# Patient Record
Sex: Female | Born: 1960 | Race: White | Hispanic: No | Marital: Single | Smoking: Never smoker
Health system: Southern US, Community
[De-identification: ages and names within clinical notes are randomized; demographics above are authoritative.]

## PROBLEM LIST (undated history)

## (undated) DIAGNOSIS — F5000 Anorexia nervosa, unspecified: Secondary | ICD-10-CM

## (undated) DIAGNOSIS — N644 Mastodynia: Secondary | ICD-10-CM

## (undated) DIAGNOSIS — M810 Age-related osteoporosis without current pathological fracture: Secondary | ICD-10-CM

## (undated) DIAGNOSIS — E039 Hypothyroidism, unspecified: Secondary | ICD-10-CM

## (undated) DIAGNOSIS — F411 Generalized anxiety disorder: Secondary | ICD-10-CM

## (undated) DIAGNOSIS — F603 Borderline personality disorder: Secondary | ICD-10-CM

## (undated) DIAGNOSIS — L02239 Carbuncle of trunk, unspecified: Secondary | ICD-10-CM

## (undated) DIAGNOSIS — E079 Disorder of thyroid, unspecified: Secondary | ICD-10-CM

## (undated) DIAGNOSIS — D3130 Benign neoplasm of unspecified choroid: Secondary | ICD-10-CM

## (undated) DIAGNOSIS — L02229 Furuncle of trunk, unspecified: Secondary | ICD-10-CM

## (undated) DIAGNOSIS — L259 Unspecified contact dermatitis, unspecified cause: Secondary | ICD-10-CM

## (undated) DIAGNOSIS — F5102 Adjustment insomnia: Secondary | ICD-10-CM

## (undated) DIAGNOSIS — R071 Chest pain on breathing: Secondary | ICD-10-CM

## (undated) DIAGNOSIS — R55 Syncope and collapse: Secondary | ICD-10-CM

## (undated) DIAGNOSIS — K219 Gastro-esophageal reflux disease without esophagitis: Secondary | ICD-10-CM

## (undated) DIAGNOSIS — F909 Attention-deficit hyperactivity disorder, unspecified type: Secondary | ICD-10-CM

## (undated) DIAGNOSIS — E0500 Thyrotoxicosis with diffuse goiter without thyrotoxic crisis or storm: Secondary | ICD-10-CM

## (undated) DIAGNOSIS — R222 Localized swelling, mass and lump, trunk: Secondary | ICD-10-CM

## (undated) DIAGNOSIS — S8000XA Contusion of unspecified knee, initial encounter: Secondary | ICD-10-CM

## (undated) HISTORY — DX: Borderline personality disorder: F60.3

## (undated) HISTORY — DX: Thyrotoxicosis with diffuse goiter without thyrotoxic crisis or storm: E05.00

## (undated) HISTORY — DX: Hypothyroidism, unspecified: E03.9

## (undated) HISTORY — DX: Unspecified contact dermatitis, unspecified cause: L25.9

## (undated) HISTORY — DX: Localized swelling, mass and lump, trunk: R22.2

## (undated) HISTORY — DX: Carbuncle of trunk, unspecified: L02.239

## (undated) HISTORY — DX: Attention-deficit hyperactivity disorder, unspecified type: F90.9

## (undated) HISTORY — DX: Contusion of unspecified knee, initial encounter: S80.00XA

## (undated) HISTORY — DX: Chest pain on breathing: R07.1

## (undated) HISTORY — DX: Age-related osteoporosis without current pathological fracture: M81.0

## (undated) HISTORY — DX: Mastodynia: N64.4

## (undated) HISTORY — DX: Gastro-esophageal reflux disease without esophagitis: K21.9

## (undated) HISTORY — DX: Anorexia nervosa, unspecified: F50.00

## (undated) HISTORY — DX: Furuncle of trunk, unspecified: L02.229

## (undated) HISTORY — PX: CHOLECYSTECTOMY: SHX55

## (undated) HISTORY — DX: Benign neoplasm of unspecified choroid: D31.30

## (undated) HISTORY — DX: Adjustment insomnia: F51.02

## (undated) HISTORY — PX: TONSILLECTOMY: SUR1361

## (undated) HISTORY — PX: THYROID SURGERY: SHX805

## (undated) HISTORY — DX: Generalized anxiety disorder: F41.1

---

## 1999-08-15 ENCOUNTER — Other Ambulatory Visit: Admission: RE | Admit: 1999-08-15 | Discharge: 1999-08-15 | Payer: Self-pay | Admitting: Gynecology

## 2000-01-14 ENCOUNTER — Encounter: Payer: Self-pay | Admitting: Emergency Medicine

## 2000-01-14 ENCOUNTER — Emergency Department (HOSPITAL_COMMUNITY): Admission: EM | Admit: 2000-01-14 | Discharge: 2000-01-14 | Payer: Self-pay

## 2000-05-27 ENCOUNTER — Encounter: Admission: RE | Admit: 2000-05-27 | Discharge: 2000-08-25 | Payer: Self-pay | Admitting: Psychology

## 2007-12-23 ENCOUNTER — Emergency Department (HOSPITAL_BASED_OUTPATIENT_CLINIC_OR_DEPARTMENT_OTHER): Admission: EM | Admit: 2007-12-23 | Discharge: 2007-12-23 | Payer: Self-pay | Admitting: Emergency Medicine

## 2008-08-07 ENCOUNTER — Emergency Department (HOSPITAL_BASED_OUTPATIENT_CLINIC_OR_DEPARTMENT_OTHER): Admission: EM | Admit: 2008-08-07 | Discharge: 2008-08-07 | Payer: Self-pay | Admitting: Emergency Medicine

## 2010-10-14 LAB — BASIC METABOLIC PANEL
BUN: 12 mg/dL (ref 6–23)
CO2: 30 mEq/L (ref 19–32)
Calcium: 8.5 mg/dL (ref 8.4–10.5)
Chloride: 103 mEq/L (ref 96–112)
Creatinine, Ser: 0.7 mg/dL (ref 0.4–1.2)
GFR calc Af Amer: >60 mL/min (ref >60)
GFR calc non Af Amer: >60 mL/min (ref >60)
Glucose, Bld: 75 mg/dL (ref 70–99)
Potassium: 4.3 mEq/L (ref 3.5–5.1)
Sodium: 138 mEq/L (ref 135–145)

## 2010-10-14 LAB — DIFFERENTIAL
Basophils Absolute: 0.1 10*3/uL (ref 0.0–0.1)
Basophils Relative: 1 % (ref 0–1)
Eosinophils Absolute: 0.1 10*3/uL (ref 0.0–0.7)
Monocytes Absolute: 0.3 10*3/uL (ref 0.1–1.0)
Monocytes Relative: 8 % (ref 3–12)

## 2010-10-14 LAB — URINALYSIS, ROUTINE W REFLEX MICROSCOPIC
Bilirubin Urine: NEGATIVE
Nitrite: NEGATIVE
Specific Gravity, Urine: 1.031 — ABNORMAL HIGH (ref 1.005–1.030)
Urobilinogen, UA: 0.2 mg/dL (ref 0.0–1.0)
pH: 5.5 (ref 5.0–8.0)

## 2010-10-14 LAB — CBC
Hemoglobin: 11.2 g/dL — ABNORMAL LOW (ref 12.0–15.0)
MCHC: 32.6 g/dL (ref 30.0–36.0)
MCV: 90.2 fL (ref 78.0–100.0)
RBC: 3.82 MIL/uL — ABNORMAL LOW (ref 3.87–5.11)
RDW: 11.8 % (ref 11.5–15.5)

## 2010-10-14 LAB — URINE MICROSCOPIC-ADD ON

## 2010-10-14 LAB — PREGNANCY, URINE: Preg Test, Ur: NEGATIVE

## 2010-11-06 ENCOUNTER — Emergency Department (INDEPENDENT_AMBULATORY_CARE_PROVIDER_SITE_OTHER): Payer: Self-pay

## 2010-11-06 ENCOUNTER — Emergency Department (HOSPITAL_BASED_OUTPATIENT_CLINIC_OR_DEPARTMENT_OTHER)
Admission: EM | Admit: 2010-11-06 | Discharge: 2010-11-06 | Disposition: A | Discharge status: Home / Self Care | Payer: Self-pay | Attending: Emergency Medicine | Admitting: Emergency Medicine

## 2010-11-06 DIAGNOSIS — E039 Hypothyroidism, unspecified: Secondary | ICD-10-CM | POA: Insufficient documentation

## 2010-11-06 DIAGNOSIS — H9209 Otalgia, unspecified ear: Secondary | ICD-10-CM

## 2010-11-06 DIAGNOSIS — G8929 Other chronic pain: Secondary | ICD-10-CM | POA: Insufficient documentation

## 2010-11-06 DIAGNOSIS — R42 Dizziness and giddiness: Secondary | ICD-10-CM | POA: Insufficient documentation

## 2010-11-06 LAB — DIFFERENTIAL
Basophils Absolute: 0 10*3/uL (ref 0.0–0.1)
Basophils Relative: 1 % (ref 0–1)
Eosinophils Absolute: 0.1 10*3/uL (ref 0.0–0.7)
Eosinophils Relative: 2 % (ref 0–5)
Monocytes Absolute: 0.4 10*3/uL (ref 0.1–1.0)

## 2010-11-06 LAB — COMPREHENSIVE METABOLIC PANEL
ALT: 13 U/L (ref 0–35)
AST: 20 U/L (ref 0–37)
Albumin: 3.9 g/dL (ref 3.5–5.2)
Calcium: 9.5 mg/dL (ref 8.4–10.5)
GFR calc Af Amer: >60 mL/min (ref >60)
Glucose, Bld: 88 mg/dL (ref 70–99)
Sodium: 143 mEq/L (ref 135–145)
Total Protein: 6.4 g/dL (ref 6.0–8.3)

## 2010-11-06 LAB — CBC
MCHC: 33.1 g/dL (ref 30.0–36.0)
Platelets: 162 10*3/uL (ref 150–400)
RDW: 12.2 % (ref 11.5–15.5)
WBC: 3.7 10*3/uL — ABNORMAL LOW (ref 4.0–10.5)

## 2010-11-06 LAB — URINALYSIS, ROUTINE W REFLEX MICROSCOPIC
Glucose, UA: NEGATIVE mg/dL
Hgb urine dipstick: NEGATIVE
Ketones, ur: NEGATIVE mg/dL
pH: 7.5 (ref 5.0–8.0)

## 2011-06-30 ENCOUNTER — Emergency Department (HOSPITAL_BASED_OUTPATIENT_CLINIC_OR_DEPARTMENT_OTHER)
Admission: EM | Admit: 2011-06-30 | Discharge: 2011-06-30 | Disposition: A | Discharge status: Home / Self Care | Payer: Medicare Other | Attending: Emergency Medicine | Admitting: Emergency Medicine

## 2011-06-30 ENCOUNTER — Emergency Department (INDEPENDENT_AMBULATORY_CARE_PROVIDER_SITE_OTHER): Payer: Medicare Other

## 2011-06-30 ENCOUNTER — Other Ambulatory Visit: Payer: Self-pay

## 2011-06-30 ENCOUNTER — Encounter: Payer: Self-pay | Admitting: *Deleted

## 2011-06-30 DIAGNOSIS — M549 Dorsalgia, unspecified: Secondary | ICD-10-CM

## 2011-06-30 DIAGNOSIS — E039 Hypothyroidism, unspecified: Secondary | ICD-10-CM | POA: Insufficient documentation

## 2011-06-30 DIAGNOSIS — R0602 Shortness of breath: Secondary | ICD-10-CM

## 2011-06-30 DIAGNOSIS — R079 Chest pain, unspecified: Secondary | ICD-10-CM

## 2011-06-30 DIAGNOSIS — R0789 Other chest pain: Secondary | ICD-10-CM

## 2011-06-30 DIAGNOSIS — R222 Localized swelling, mass and lump, trunk: Secondary | ICD-10-CM

## 2011-06-30 DIAGNOSIS — I499 Cardiac arrhythmia, unspecified: Secondary | ICD-10-CM

## 2011-06-30 HISTORY — DX: Disorder of thyroid, unspecified: E07.9

## 2011-06-30 LAB — CBC
Hemoglobin: 12.7 g/dL (ref 12.0–15.0)
MCHC: 33.4 g/dL (ref 30.0–36.0)
RDW: 12.5 % (ref 11.5–15.5)

## 2011-06-30 LAB — COMPREHENSIVE METABOLIC PANEL
ALT: 14 U/L (ref 0–35)
Albumin: 4.1 g/dL (ref 3.5–5.2)
Alkaline Phosphatase: 63 U/L (ref 39–117)
Glucose, Bld: 92 mg/dL (ref 70–99)
Potassium: 4 mEq/L (ref 3.5–5.1)
Sodium: 142 mEq/L (ref 135–145)
Total Protein: 6.5 g/dL (ref 6.0–8.3)

## 2011-06-30 LAB — CARDIAC PANEL(CRET KIN+CKTOT+MB+TROPI)
Relative Index: INVALID (ref 0.0–2.5)
Relative Index: INVALID (ref 0.0–2.5)
Troponin I: <0.3 ng/mL (ref <0.30)

## 2011-06-30 MED ORDER — DIPHENHYDRAMINE HCL 50 MG/ML IJ SOLN
50.0000 mg | Freq: Once | INTRAMUSCULAR | Status: AC
  Administered 2011-06-30: 50 mg via INTRAVENOUS
  Filled 2011-06-30: qty 1

## 2011-06-30 MED ORDER — OXYCODONE-ACETAMINOPHEN 5-325 MG PO TABS: 1.0000 | ORAL_TABLET | ORAL | Status: AC | PRN

## 2011-06-30 MED ORDER — MORPHINE SULFATE 4 MG/ML IJ SOLN
4.0000 mg | Freq: Once | INTRAMUSCULAR | Status: AC
  Administered 2011-06-30: 4 mg via INTRAVENOUS

## 2011-06-30 MED ORDER — IOHEXOL 350 MG/ML SOLN
80.0000 mL | Freq: Once | INTRAVENOUS | Status: AC | PRN
  Administered 2011-06-30: 80 mL via INTRAVENOUS

## 2011-06-30 MED ORDER — MORPHINE SULFATE 4 MG/ML IJ SOLN
INTRAMUSCULAR | Status: AC
  Filled 2011-06-30: qty 1

## 2011-06-30 MED ORDER — SODIUM CHLORIDE 0.9 % IV BOLUS (SEPSIS)
500.0000 mL | Freq: Once | INTRAVENOUS | Status: AC
  Administered 2011-06-30: 500 mL via INTRAVENOUS

## 2011-06-30 MED ORDER — SODIUM CHLORIDE 0.9 % IV BOLUS (SEPSIS): 500.0000 mL | Freq: Once | INTRAVENOUS | Status: DC

## 2011-06-30 MED ORDER — HYDROCORTISONE SOD SUCCINATE 100 MG IJ SOLR
200.0000 mg | Freq: Once | INTRAMUSCULAR | Status: AC
  Administered 2011-06-30: 100 mg via INTRAVENOUS
  Filled 2011-06-30: qty 2

## 2011-06-30 MED ORDER — MORPHINE SULFATE 4 MG/ML IJ SOLN: 4.0000 mg | Freq: Once | INTRAMUSCULAR | Status: DC

## 2011-06-30 NOTE — ED Notes (Signed)
Pt c/o generalized cp and SOB x 1 week

## 2011-06-30 NOTE — ED Notes (Signed)
I met patient at waiting as secretary told me the patient was having chest pain at check-in. Patient stated she feels her pain may be only chest-wall pain, but that she has had heart related problems in her previous history. I took patient to room and notified nurse. I took ECG.

## 2011-06-30 NOTE — ED Provider Notes (Signed)
History     CSN: [id number]  Arrival date & time 06/30/11  1339   First MD Initiated Contact with Patient 06/30/11 1353      Chief Complaint  Patient presents with  . Chest Pain  . Shortness of Breath    (Consider location/radiation/quality/duration/timing/severity/associated sxs/prior treatment) HPI  H/o anorexia presents with chest pain shortness of breath. Patient states that she has experienced sharp left axillary, chest pain which has been constant for one week but intermittently worsening. It is worse with movement and with taking a deep breath. She also complains of generalized chest pressure which has been present for several days as well. She complains of shortness of breath with talking with exertion. She denies known injury although she states that 2 days or if his symptom onset she did fall onto her left side only on her left elbow. sHe feels that this is unrelated. She does explain that she has a "cardiac history" and states that when she was previously being treated for anorexia she was told that she had a weak heart. She also states that she's had a history of 2 lung nodules and has been unable to follow with pulmonary secondary to lack of insurance. She denies fever, chills. Denies h/o VTE in self or family. No recent hosp/surg/immob. Denies exogenous hormone use, no leg pain or swelling. Occ cough.   ED Notes, ED Provider Notes from 06/30/11 0000 to 06/30/11 13:46:48       Hennie Duos, RN 06/30/2011 13:44      Pt c/o generalized cp and SOB x 1 week    Past Medical History  Diagnosis Date  . Thyroid disease     Past Surgical History  Procedure Date  . Cholecystectomy   . Tonsillectomy     History reviewed. No pertinent family history.  History  Substance Use Topics  . Smoking status: Never Smoker   . Smokeless tobacco: Not on file  . Alcohol Use: No    OB History    Grav Para Term Preterm Abortions TAB SAB Ect Mult Living                  Review  of Systems  All other systems reviewed and are negative.   except as noted HPI  Allergies  Biaxin; Codeine; Darvon; Penicillins; Sulfa antibiotics; and Ivp dye  Home Medications   Current Outpatient Rx  Name Route Sig Dispense Refill  . OXYCODONE-ACETAMINOPHEN 5-325 MG PO TABS Oral Take 1 tablet by mouth every 4 (four) hours as needed for pain. 20 tablet 0    BP 94/73  Pulse 64  Temp(Src) 98.9 F (37.2 C) (Oral)  Ht 5\' 1"  (1.549 m)  Wt 115 lb (52.164 kg)  BMI 21.73 kg/m2  SpO2 100%  Physical Exam  Nursing note and vitals reviewed. Constitutional: She is oriented to person, place, and time. She appears well-developed.  HENT:  Head: Atraumatic.  Mouth/Throat: Oropharynx is clear and moist.  Eyes: Conjunctivae and EOM are normal. Pupils are equal, round, and reactive to light.  Neck: Normal range of motion. Neck supple. No tracheal deviation present.  Cardiovascular: Normal rate, regular rhythm, normal heart sounds and intact distal pulses.   Pulmonary/Chest: Effort normal and breath sounds normal. No respiratory distress. She has no wheezes. She has no rales. She exhibits tenderness.       +Lt axillary ttp- states it reproduces the sharp cp only  Abdominal: Soft. She exhibits no distension. There is no tenderness. There is no  rebound and no guarding.  Musculoskeletal: Normal range of motion. She exhibits no edema and no tenderness.  Neurological: She is alert and oriented to person, place, and time.  Skin: Skin is warm and dry. No rash noted.  Psychiatric: She has a normal mood and affect.    Date: 06/30/2011  Rate: 67  Rhythm: normal sinus rhythm  QRS Axis: normal  Intervals: normal  ST/T Wave abnormalities: normal  Conduction Disutrbances:none  Narrative Interpretation:   Old EKG Reviewed: unchanged   ED Course  Procedures (including critical care time)  Labs Reviewed  CBC - Abnormal; Notable for the following:    WBC 3.3 (*)    All other components within  normal limits  CARDIAC PANEL(CRET KIN+CKTOT+MB+TROPI)  COMPREHENSIVE METABOLIC PANEL  CARDIAC PANEL(CRET KIN+CKTOT+MB+TROPI)   Dg Chest 2 View  06/30/2011  *RADIOLOGY REPORT*  Clinical Data: Left-sided chest pain radiating into the back over the past week.  Shortness of breath.  Arrhythmia.  CHEST - 2 VIEW 06/30/2011:  Comparison: None.  Findings: Cardiac silhouette normal in size.  Hilar and mediastinal contours unremarkable.  Lungs clear.  Bronchovascular markings normal.  No pleural effusions.  Retrosternal lobular "mass" on the lateral image.  Visualized bony thorax intact.  IMPRESSION: No acute cardiopulmonary disease.  Lobular retrosternal "mass" on the lateral image may be due to subpleural fat.  In the absence of prior studies for comparison, short-term interval follow-up chest x- ray in 2-3 months is suggested.  Original Report Authenticated By: Arnell Sieving, M.D.   Ct Angio Chest W/cm &/or Wo Cm  06/30/2011  *RADIOLOGY REPORT*  Clinical Data:  Evaluate mediastinal mass, pulmonary embolus.  Left lateral rib and chest pain radiating into the back.  Symptoms for 1 week.  Shortness of breath, arrhythmia.  Evaluate for pulmonary embolus.  CT ANGIOGRAPHY CHEST WITH CONTRAST  Technique:  Multidetector CT imaging of the chest was performed using the standard protocol during bolus administration of intravenous contrast.  Multiplanar CT image reconstructions including MIPs were obtained to evaluate the vascular anatomy.  Contrast: 80mL OMNIPAQUE IOHEXOL 350 MG/ML IV SOLN  Comparison:  Chest x-ray 06/30/2011  Findings:  The pulmonary arteries are well opacified.  There is no evidence for acute pulmonary embolus.  Heart size is normal.  No evidence for pulmonary edema.  No pulmonary nodules, pleural effusions, or infiltrates.  Within the anterior chest wall, there is a fluid density oval mass, accounting for the chest x-ray finding.  This measures 2.7 x 1.4 cm and 0 HU in density. Findings are consistent  with benign process.  Differential diagnosis includes a neurogenic tumor such as schwannoma or neurofibroma. Less likely, findings could be related to a a foregut duplication cyst. Images of the upper abdomen are unremarkable. Visualized osseous structures have a normal appearance.  Review of the MIP images confirms the above findings.  IMPRESSION:  1.  Technically adequate exam showing no evidence for acute pulmonary embolus. 2.  Probably benign low attenuation anterior chest wall lesion measuring 2.7 cm.  Further evaluation with chest MRI with contrast is suggested.  This can be performed in 6 months to assess stability and imaging features.  Original Report Authenticated By: Patterson Hammersmith, M.D.     1. Atypical chest pain   2. Chest wall mass       MDM  Atypical chest pain that has been present for approximately one week. The patient doesn't have any known risk factors for pulmonary embolism but she does have an abnormal chest  x-ray with possible mass on lateral image. She also has history of lung nodules and did not have primary care pulmonary followup. Her pain is 3/10 at this time she is declining pain medication. CT of her chest in order to further workup and evaluation.  EKG, labs reviewed and unremarkable. CTA pending. She was premedicated with hydrocortisone and Benadryl. She's not having an obvious acute reaction at this time. She complains of headache and appears anxious. Morphine ordered. Reassess.  Reviewed results with the patient. This includes her abnormal chest x-ray and CT scan. There is no pulmonary embolism. She can followup chest mass as an outpatient. She has been given the images of the disc as well as a printout of the results. The patient has had 2 sets of negative cardiac enzymes with ongoing chest pain for a week. Have very low suspicion that this is due to coronary syndrome. She does have some reproducible chest wall tenderness. Will send home with a prescription for  Percocet and give her referrals for primary care followup.       Forbes Cellar, MD 06/30/11 [phone]

## 2011-07-01 ENCOUNTER — Telehealth (HOSPITAL_BASED_OUTPATIENT_CLINIC_OR_DEPARTMENT_OTHER): Payer: Self-pay | Admitting: *Deleted

## 2011-07-01 NOTE — ED Notes (Signed)
Multiple phone calls x 6 today .  Patient called and spoke with Nursing Secretary and told them that she was seen yesterday, stated she left here and went to [facility] and saw her "mother's " pulmonology doctor and was told she needed to follow up asap.  Patient stated she needs a referral to [facility].  Patient's discharge instructions stated to follow up with her PCP in 2 days if she was still having problems, per Dr. Hyman Hopes.  Patient stated she does not have a PCP and that she comes to the emergency departments when she needs care.  Patient was told by this RN that I would call over and make the referral.  Call was placed to [facility] and attempted x 2 to make an appointment.  Was told by receptionist that I could not make a referral due to Dr. Hyman Hopes not being in their referral system.  Call was placed to patient and info was given.  Patient became hysterical and was yelling at this nurse. I was told to forget it and phone call was disconnected.  Patient called back to this nurse 3 more times within the next 2 hours.  Patient left a message on the 3rd call stating I needed to call Melissa at [facility] pul[facility]ology and make her an appointment, stated that I lied to her about being able to make a referral.  Spoke with a receptionist at [phone] about the above events.  Was able this time to get the patient an appointment on 07/07/10 @ 2pm with Dr. Chales Abrahams and that the patient needed to bring her medical records or her appointment would be cancelled.  Call placed to patient and info was given.  Verified that the patient received a copy of her CT and medical records to take with her.

## 2011-08-18 ENCOUNTER — Encounter: Payer: Self-pay | Admitting: Surgery

## 2011-08-19 ENCOUNTER — Ambulatory Visit: Payer: Medicare Other

## 2011-08-26 ENCOUNTER — Ambulatory Visit: Payer: Medicare Other | Admitting: Surgery

## 2011-11-04 ENCOUNTER — Ambulatory Visit: Payer: Medicare Other

## 2012-01-27 ENCOUNTER — Emergency Department (HOSPITAL_BASED_OUTPATIENT_CLINIC_OR_DEPARTMENT_OTHER)
Admission: EM | Admit: 2012-01-27 | Discharge: 2012-01-28 | Disposition: A | Discharge status: Home / Self Care | Payer: Medicare Other | Attending: Emergency Medicine | Admitting: Emergency Medicine

## 2012-01-27 ENCOUNTER — Encounter (HOSPITAL_BASED_OUTPATIENT_CLINIC_OR_DEPARTMENT_OTHER): Payer: Self-pay | Admitting: *Deleted

## 2012-01-27 ENCOUNTER — Emergency Department (HOSPITAL_BASED_OUTPATIENT_CLINIC_OR_DEPARTMENT_OTHER): Payer: Medicare Other

## 2012-01-27 DIAGNOSIS — S62309A Unspecified fracture of unspecified metacarpal bone, initial encounter for closed fracture: Secondary | ICD-10-CM | POA: Insufficient documentation

## 2012-01-27 DIAGNOSIS — Y998 Other external cause status: Secondary | ICD-10-CM | POA: Insufficient documentation

## 2012-01-27 DIAGNOSIS — M810 Age-related osteoporosis without current pathological fracture: Secondary | ICD-10-CM | POA: Insufficient documentation

## 2012-01-27 DIAGNOSIS — S62306A Unspecified fracture of fifth metacarpal bone, right hand, initial encounter for closed fracture: Secondary | ICD-10-CM

## 2012-01-27 DIAGNOSIS — Y9389 Activity, other specified: Secondary | ICD-10-CM | POA: Insufficient documentation

## 2012-01-27 DIAGNOSIS — W19XXXA Unspecified fall, initial encounter: Secondary | ICD-10-CM | POA: Insufficient documentation

## 2012-01-27 DIAGNOSIS — E039 Hypothyroidism, unspecified: Secondary | ICD-10-CM | POA: Insufficient documentation

## 2012-01-27 DIAGNOSIS — K219 Gastro-esophageal reflux disease without esophagitis: Secondary | ICD-10-CM | POA: Insufficient documentation

## 2012-01-27 MED ORDER — MORPHINE SULFATE 4 MG/ML IJ SOLN: 4.0000 mg | Freq: Once | INTRAMUSCULAR | Status: DC

## 2012-01-27 MED ORDER — KETOROLAC TROMETHAMINE 60 MG/2ML IM SOLN
60.0000 mg | Freq: Once | INTRAMUSCULAR | Status: AC
  Administered 2012-01-27: 60 mg via INTRAMUSCULAR
  Filled 2012-01-27: qty 2

## 2012-01-27 MED ORDER — ONDANSETRON 4 MG PO TBDP
4.0000 mg | ORAL_TABLET | Freq: Once | ORAL | Status: AC
  Administered 2012-01-27: 4 mg via ORAL
  Filled 2012-01-27: qty 1

## 2012-01-27 NOTE — ED Notes (Signed)
Pt slipped and fell injuring right hand and wrist, +PMS to extremity.

## 2012-01-27 NOTE — ED Notes (Signed)
Pt has bruising and swelling noted to outer aspect of right hand and palmar surface, +PMS.

## 2012-01-28 MED ORDER — ONDANSETRON 8 MG PO TBDP: 8.0000 mg | ORAL_TABLET | Freq: Three times a day (TID) | ORAL | Status: AC | PRN

## 2012-01-28 MED ORDER — OXYCODONE-ACETAMINOPHEN 5-325 MG PO TABS: 1.0000 | ORAL_TABLET | Freq: Four times a day (QID) | ORAL | Status: AC | PRN

## 2012-01-28 NOTE — ED Provider Notes (Signed)
History     CSN: [id number]  Arrival date & time 01/27/12  2156   First MD Initiated Contact with Patient 01/27/12 2226      Chief Complaint  Patient presents with  . Wrist Pain    (Consider location/radiation/quality/duration/timing/severity/associated sxs/prior treatment) HPI Patient is a 51 year old female who presents today complaining of acute onset of right wrist and right hand pain that she rates as a 10 out of 10. Patient was handling one of her dogs a when she fell on the affected hand. She is right-hand dominant. This occurred prior to arrival. Pain is worse with palpation movement. Patient has not taken any medications for this. She has history of prior right wrist fracture. Patient has obvious swelling. There are no other associated or modifying factors. Past Medical History  Diagnosis Date  . Thyroid disease   . Osteoporosis, unspecified   . Abdominal tenderness   . Transient disorder of initiating or maintaining sleep   . Anorexia nervosa   . Painful respiration   . Anxiety state, unspecified   . Attention deficit disorder with hyperactivity   . Benign neoplasm of choroid     Right  . Mastodynia   . Borderline personality disorder   . Carbuncle and furuncle of trunk   . Contact dermatitis and other eczema, due to unspecified cause   . Contusion of knee   . Esophageal reflux   . 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 Unspecified hypothyroidism   . Swelling, mass, or lump in chest     Past Surgical History  Procedure Date  . Cholecystectomy   . Tonsillectomy   . Tonsillectomy   . Thyroid surgery     History reviewed. No pertinent family history.  History  Substance Use Topics  . Smoking status: Never Smoker   . Smokeless tobacco: Not on file  . Alcohol Use: No    OB History    Grav Para Term Preterm Abortions TAB SAB Ect Mult Living                  Review of Systems  Constitutional: Negative.   HENT:  Negative.   Eyes: Negative.   Respiratory: Negative.   Cardiovascular: Negative.   Gastrointestinal: Negative.   Genitourinary: Negative.   Musculoskeletal:       Right wrist and hand pain  Skin: Negative.   Neurological: Negative.   Hematological: Negative.   Psychiatric/Behavioral: Negative.   All other systems reviewed and are negative.    Allergies  Cephalexin; Azithromycin; Clarithromycin; Codeine; Darvon; Penicillins; Sulfa antibiotics; and Ivp dye  Home Medications   Current Outpatient Rx  Name Route Sig Dispense Refill  . FAMCICLOVIR 500 MG PO TABS Oral Take 500 mg by mouth 2 (two) times daily.    Marland Kitchen LEVOTHYROXINE SODIUM 88 MCG PO TABS Oral Take 88 mcg by mouth daily.      BP 98/70  Pulse 57  Temp 98.7 F (37.1 C) (Oral)  Resp 18  Ht 5\' 1"  (1.549 m)  Wt 118 lb (53.524 kg)  BMI 22.30 kg/m2  SpO2 99%  Physical Exam  Nursing note and vitals reviewed. GEN: Well-developed, well-nourished female in no distress HEENT: Atraumatic, normocephalic.  EYES: PERRLA BL, no scleral icterus. NECK: Trachea midline, no meningismus CV: regular rate and rhythm. No murmurs, rubs, or gallops PULM: No respiratory distress.  No crackles, wheezes, or rales. GI: soft, non-tender. No guarding, rebound, or tenderness. + bowel sounds  GU: deferred  Neuro: cranial nerves grossly 2-12 intact, no abnormalities of strength or sensation, A and O x 3 MSK: Patient with right hand swelling along the right fifth metacarpal with ecchymosis appreciated both on the dorsum and palmar surface of the affected area. She is neurovascularly intact. Patient also complains of tenderness to palpation over the right wrist with some swelling noted but no obvious deformity. Otherwise within normal limits  Skin: No rashes petechiae, purpura, or jaundice Psych: no abnormality of mood   ED Course  Procedures (including critical care time)  Labs Reviewed - No data to display Dg Wrist Complete Right  01/27/2012   *RADIOLOGY REPORT*  Clinical Data: Fall, right wrist and hand pain.  RIGHT WRIST - COMPLETE 3+ VIEW  Comparison: None.  Findings: There is an oblique fracture through the distal fifth metacarpal with radial and volar angulation. Overlying soft tissue swelling.  No additional fracture identified. No dislocation.  No aggressive osseous lesion.  IMPRESSION: Fifth metacarpal bone fracture.  Original Report Authenticated By: Waneta Martins, M.D.   Dg Hand Complete Right  01/27/2012  *RADIOLOGY REPORT*  Clinical Data: Pain after injury with dog leash.  RIGHT HAND - COMPLETE 3+ VIEW  Comparison: Wrist 01/27/2012  Findings: There is an oblique fracture involving distal aspect of the fifth metacarpal.  There is associated soft tissue swelling. There is volar angulation at the fracture site.  No other fractures are identified.  IMPRESSION: Fifth metacarpal fracture.  Original Report Authenticated By: Patterson Hammersmith, M.D.     1. Fracture of fifth metacarpal bone of right hand       MDM  Patient was by myself. Based on evaluation patient had plain films of the right wrist and right hand. She did have a fractured right fifth metacarpal. Patient did not have significant displacement. She was given Toradol and Zofran for pain and nausea associated with this. She had no additional injuries. Patient preferred not to receive narcotic medication she would be driving home. She did have an ulnar gutter splint placed. She was reassessed following placement of this under my supervision. She was NVI.  Patient did have a sling placed as well. She was given referral information to Dr. Melvyn Novas from hand. She was also told she could followup with a hand surgeon or her choice. Patient was discharged in good condition.        Cyndra Numbers, MD 01/28/12 [phone]

## 2013-11-28 DIAGNOSIS — F1010 Alcohol abuse, uncomplicated: Secondary | ICD-10-CM | POA: Insufficient documentation

## 2013-11-28 DIAGNOSIS — F419 Anxiety disorder, unspecified: Secondary | ICD-10-CM | POA: Insufficient documentation

## 2013-11-28 DIAGNOSIS — F410 Panic disorder [episodic paroxysmal anxiety] without agoraphobia: Secondary | ICD-10-CM | POA: Insufficient documentation

## 2013-11-28 DIAGNOSIS — F603 Borderline personality disorder: Secondary | ICD-10-CM | POA: Insufficient documentation

## 2013-11-28 DIAGNOSIS — E0500 Thyrotoxicosis with diffuse goiter without thyrotoxic crisis or storm: Secondary | ICD-10-CM | POA: Insufficient documentation

## 2013-11-28 DIAGNOSIS — E039 Hypothyroidism, unspecified: Secondary | ICD-10-CM | POA: Insufficient documentation

## 2013-11-28 DIAGNOSIS — D3130 Benign neoplasm of unspecified choroid: Secondary | ICD-10-CM | POA: Insufficient documentation

## 2013-11-28 DIAGNOSIS — L259 Unspecified contact dermatitis, unspecified cause: Secondary | ICD-10-CM | POA: Insufficient documentation

## 2013-11-28 DIAGNOSIS — F5000 Anorexia nervosa, unspecified: Secondary | ICD-10-CM | POA: Insufficient documentation

## 2013-11-28 DIAGNOSIS — R911 Solitary pulmonary nodule: Secondary | ICD-10-CM | POA: Insufficient documentation

## 2013-11-28 DIAGNOSIS — F5102 Adjustment insomnia: Secondary | ICD-10-CM | POA: Insufficient documentation

## 2013-11-28 DIAGNOSIS — K219 Gastro-esophageal reflux disease without esophagitis: Secondary | ICD-10-CM | POA: Insufficient documentation

## 2013-11-28 DIAGNOSIS — G43909 Migraine, unspecified, not intractable, without status migrainosus: Secondary | ICD-10-CM | POA: Insufficient documentation

## 2013-11-28 DIAGNOSIS — F329 Major depressive disorder, single episode, unspecified: Secondary | ICD-10-CM | POA: Insufficient documentation

## 2013-11-28 DIAGNOSIS — F988 Other specified behavioral and emotional disorders with onset usually occurring in childhood and adolescence: Secondary | ICD-10-CM | POA: Insufficient documentation

## 2013-11-28 DIAGNOSIS — F32A Depression, unspecified: Secondary | ICD-10-CM | POA: Insufficient documentation

## 2014-02-11 DIAGNOSIS — R0602 Shortness of breath: Secondary | ICD-10-CM | POA: Insufficient documentation

## 2014-02-11 DIAGNOSIS — J309 Allergic rhinitis, unspecified: Secondary | ICD-10-CM | POA: Insufficient documentation

## 2014-02-11 DIAGNOSIS — M810 Age-related osteoporosis without current pathological fracture: Secondary | ICD-10-CM | POA: Insufficient documentation

## 2014-02-11 DIAGNOSIS — R55 Syncope and collapse: Secondary | ICD-10-CM | POA: Insufficient documentation

## 2014-02-11 DIAGNOSIS — G629 Polyneuropathy, unspecified: Secondary | ICD-10-CM | POA: Insufficient documentation

## 2014-02-11 DIAGNOSIS — I471 Supraventricular tachycardia: Secondary | ICD-10-CM | POA: Insufficient documentation

## 2014-02-11 DIAGNOSIS — H93299 Other abnormal auditory perceptions, unspecified ear: Secondary | ICD-10-CM | POA: Insufficient documentation

## 2014-02-11 DIAGNOSIS — E559 Vitamin D deficiency, unspecified: Secondary | ICD-10-CM | POA: Insufficient documentation

## 2014-03-21 DIAGNOSIS — R42 Dizziness and giddiness: Secondary | ICD-10-CM | POA: Insufficient documentation

## 2014-03-21 DIAGNOSIS — M1990 Unspecified osteoarthritis, unspecified site: Secondary | ICD-10-CM | POA: Insufficient documentation

## 2014-07-07 DIAGNOSIS — J029 Acute pharyngitis, unspecified: Secondary | ICD-10-CM | POA: Diagnosis not present

## 2014-07-07 DIAGNOSIS — R509 Fever, unspecified: Secondary | ICD-10-CM | POA: Diagnosis not present

## 2014-07-10 DIAGNOSIS — J209 Acute bronchitis, unspecified: Secondary | ICD-10-CM | POA: Diagnosis not present

## 2014-11-19 DIAGNOSIS — R110 Nausea: Secondary | ICD-10-CM | POA: Diagnosis not present

## 2014-11-19 DIAGNOSIS — R197 Diarrhea, unspecified: Secondary | ICD-10-CM | POA: Diagnosis not present

## 2014-11-19 DIAGNOSIS — E559 Vitamin D deficiency, unspecified: Secondary | ICD-10-CM | POA: Diagnosis not present

## 2014-11-19 DIAGNOSIS — E039 Hypothyroidism, unspecified: Secondary | ICD-10-CM | POA: Diagnosis not present

## 2014-11-19 DIAGNOSIS — M1612 Unilateral primary osteoarthritis, left hip: Secondary | ICD-10-CM | POA: Diagnosis not present

## 2014-11-19 DIAGNOSIS — G4700 Insomnia, unspecified: Secondary | ICD-10-CM | POA: Diagnosis not present

## 2014-12-06 DIAGNOSIS — M25559 Pain in unspecified hip: Secondary | ICD-10-CM | POA: Diagnosis not present

## 2014-12-06 DIAGNOSIS — M545 Low back pain: Secondary | ICD-10-CM | POA: Diagnosis not present

## 2014-12-06 DIAGNOSIS — M25552 Pain in left hip: Secondary | ICD-10-CM | POA: Diagnosis not present

## 2014-12-26 DIAGNOSIS — Z7189 Other specified counseling: Secondary | ICD-10-CM | POA: Diagnosis not present

## 2014-12-26 DIAGNOSIS — R300 Dysuria: Secondary | ICD-10-CM | POA: Diagnosis not present

## 2014-12-26 DIAGNOSIS — N390 Urinary tract infection, site not specified: Secondary | ICD-10-CM | POA: Diagnosis not present

## 2015-01-02 DIAGNOSIS — R109 Unspecified abdominal pain: Secondary | ICD-10-CM | POA: Diagnosis not present

## 2015-01-02 DIAGNOSIS — K7689 Other specified diseases of liver: Secondary | ICD-10-CM | POA: Diagnosis not present

## 2015-01-02 DIAGNOSIS — R3915 Urgency of urination: Secondary | ICD-10-CM | POA: Diagnosis not present

## 2015-01-10 DIAGNOSIS — R350 Frequency of micturition: Secondary | ICD-10-CM | POA: Diagnosis not present

## 2015-01-10 DIAGNOSIS — R3989 Other symptoms and signs involving the genitourinary system: Secondary | ICD-10-CM | POA: Diagnosis not present

## 2015-01-14 DIAGNOSIS — M1990 Unspecified osteoarthritis, unspecified site: Secondary | ICD-10-CM | POA: Diagnosis not present

## 2015-01-14 DIAGNOSIS — R5381 Other malaise: Secondary | ICD-10-CM | POA: Diagnosis not present

## 2015-01-14 DIAGNOSIS — I951 Orthostatic hypotension: Secondary | ICD-10-CM | POA: Diagnosis not present

## 2015-01-14 DIAGNOSIS — R5383 Other fatigue: Secondary | ICD-10-CM | POA: Diagnosis not present

## 2015-01-14 DIAGNOSIS — R12 Heartburn: Secondary | ICD-10-CM | POA: Diagnosis not present

## 2015-01-14 DIAGNOSIS — R110 Nausea: Secondary | ICD-10-CM | POA: Diagnosis not present

## 2015-01-14 DIAGNOSIS — F329 Major depressive disorder, single episode, unspecified: Secondary | ICD-10-CM | POA: Diagnosis not present

## 2015-01-14 DIAGNOSIS — E0590 Thyrotoxicosis, unspecified without thyrotoxic crisis or storm: Secondary | ICD-10-CM | POA: Diagnosis not present

## 2015-01-14 DIAGNOSIS — E559 Vitamin D deficiency, unspecified: Secondary | ICD-10-CM | POA: Diagnosis not present

## 2015-01-14 DIAGNOSIS — Z1211 Encounter for screening for malignant neoplasm of colon: Secondary | ICD-10-CM | POA: Diagnosis not present

## 2015-01-14 DIAGNOSIS — E876 Hypokalemia: Secondary | ICD-10-CM | POA: Diagnosis not present

## 2015-01-14 DIAGNOSIS — M810 Age-related osteoporosis without current pathological fracture: Secondary | ICD-10-CM | POA: Diagnosis not present

## 2015-01-14 DIAGNOSIS — G4700 Insomnia, unspecified: Secondary | ICD-10-CM | POA: Diagnosis not present

## 2015-01-14 DIAGNOSIS — R109 Unspecified abdominal pain: Secondary | ICD-10-CM | POA: Diagnosis not present

## 2015-01-16 DIAGNOSIS — G4700 Insomnia, unspecified: Secondary | ICD-10-CM | POA: Diagnosis not present

## 2015-01-22 DIAGNOSIS — R350 Frequency of micturition: Secondary | ICD-10-CM | POA: Diagnosis not present

## 2015-01-22 DIAGNOSIS — R109 Unspecified abdominal pain: Secondary | ICD-10-CM | POA: Diagnosis not present

## 2015-01-22 DIAGNOSIS — N390 Urinary tract infection, site not specified: Secondary | ICD-10-CM | POA: Diagnosis not present

## 2015-01-22 DIAGNOSIS — R3989 Other symptoms and signs involving the genitourinary system: Secondary | ICD-10-CM | POA: Diagnosis not present

## 2015-02-19 DIAGNOSIS — R3989 Other symptoms and signs involving the genitourinary system: Secondary | ICD-10-CM | POA: Diagnosis not present

## 2015-02-19 DIAGNOSIS — R350 Frequency of micturition: Secondary | ICD-10-CM | POA: Diagnosis not present

## 2015-04-18 DIAGNOSIS — R0789 Other chest pain: Secondary | ICD-10-CM | POA: Diagnosis not present

## 2015-04-18 DIAGNOSIS — R05 Cough: Secondary | ICD-10-CM | POA: Diagnosis not present

## 2015-04-18 DIAGNOSIS — J42 Unspecified chronic bronchitis: Secondary | ICD-10-CM | POA: Diagnosis not present

## 2015-04-29 DIAGNOSIS — I471 Supraventricular tachycardia: Secondary | ICD-10-CM | POA: Diagnosis not present

## 2015-04-29 DIAGNOSIS — R079 Chest pain, unspecified: Secondary | ICD-10-CM | POA: Diagnosis not present

## 2015-04-29 DIAGNOSIS — I951 Orthostatic hypotension: Secondary | ICD-10-CM | POA: Diagnosis not present

## 2015-04-29 DIAGNOSIS — F419 Anxiety disorder, unspecified: Secondary | ICD-10-CM | POA: Diagnosis not present

## 2015-04-29 DIAGNOSIS — R110 Nausea: Secondary | ICD-10-CM | POA: Diagnosis not present

## 2015-05-21 DIAGNOSIS — E559 Vitamin D deficiency, unspecified: Secondary | ICD-10-CM | POA: Diagnosis not present

## 2015-05-21 DIAGNOSIS — E039 Hypothyroidism, unspecified: Secondary | ICD-10-CM | POA: Diagnosis not present

## 2015-05-29 DIAGNOSIS — J42 Unspecified chronic bronchitis: Secondary | ICD-10-CM | POA: Diagnosis not present

## 2015-05-29 DIAGNOSIS — J309 Allergic rhinitis, unspecified: Secondary | ICD-10-CM | POA: Diagnosis not present

## 2015-05-29 DIAGNOSIS — R5383 Other fatigue: Secondary | ICD-10-CM | POA: Diagnosis not present

## 2015-08-05 DIAGNOSIS — B9789 Other viral agents as the cause of diseases classified elsewhere: Secondary | ICD-10-CM | POA: Diagnosis not present

## 2015-08-05 DIAGNOSIS — J309 Allergic rhinitis, unspecified: Secondary | ICD-10-CM | POA: Diagnosis not present

## 2015-08-05 DIAGNOSIS — J069 Acute upper respiratory infection, unspecified: Secondary | ICD-10-CM | POA: Diagnosis not present

## 2015-08-15 DIAGNOSIS — R05 Cough: Secondary | ICD-10-CM | POA: Diagnosis not present

## 2015-08-15 DIAGNOSIS — J449 Chronic obstructive pulmonary disease, unspecified: Secondary | ICD-10-CM | POA: Diagnosis not present

## 2015-08-15 DIAGNOSIS — J069 Acute upper respiratory infection, unspecified: Secondary | ICD-10-CM | POA: Diagnosis not present

## 2015-09-04 ENCOUNTER — Ambulatory Visit: Payer: Medicare Other | Admitting: Family Medicine

## 2015-09-05 ENCOUNTER — Ambulatory Visit: Payer: Medicare Other | Admitting: Family Medicine

## 2015-09-05 ENCOUNTER — Ambulatory Visit (HOSPITAL_BASED_OUTPATIENT_CLINIC_OR_DEPARTMENT_OTHER)
Admission: RE | Admit: 2015-09-05 | Discharge: 2015-09-05 | Disposition: A | Discharge status: Home / Self Care | Payer: Medicare Other | Source: Ambulatory Visit | Attending: Family Medicine | Admitting: Family Medicine

## 2015-09-05 ENCOUNTER — Ambulatory Visit (INDEPENDENT_AMBULATORY_CARE_PROVIDER_SITE_OTHER): Payer: Medicare Other | Admitting: Family Medicine

## 2015-09-05 ENCOUNTER — Encounter: Payer: Self-pay | Admitting: Family Medicine

## 2015-09-05 VITALS — BP 114/80 | HR 81 | Ht 61.0 in | Wt 128.0 lb

## 2015-09-05 DIAGNOSIS — M79672 Pain in left foot: Secondary | ICD-10-CM | POA: Diagnosis not present

## 2015-09-05 DIAGNOSIS — M2012 Hallux valgus (acquired), left foot: Secondary | ICD-10-CM | POA: Insufficient documentation

## 2015-09-05 DIAGNOSIS — S99922A Unspecified injury of left foot, initial encounter: Secondary | ICD-10-CM | POA: Diagnosis not present

## 2015-09-05 MED ORDER — HYDROCODONE-ACETAMINOPHEN 5-325 MG PO TABS: 1.0000 | ORAL_TABLET | Freq: Four times a day (QID) | ORAL | Status: DC | PRN

## 2015-09-05 NOTE — Patient Instructions (Addendum)
You have a fifth metatarsal contusion, peroneal tendon strain. Icing 15 minutes at a time 3-4 times a day. Norco as needed for severe pain (no driving on this medicine). Consider a topical medicine like capsaicin, aspercreme up to 4 times a day. Hard soled shoe or walking boot if needed to help with pain. Crutches are an option as well. If not improving over next 2 weeks follow up with me at that time.

## 2015-09-09 DIAGNOSIS — S99922A Unspecified injury of left foot, initial encounter: Secondary | ICD-10-CM | POA: Insufficient documentation

## 2015-09-09 NOTE — Progress Notes (Signed)
PCP: No primary care provider on file.  Subjective:   HPI: Patient is a 55 y.o. female here for left foot pain.  Patient reports 1 week ago she was walking in her backyard, stepped on a brick and inverted left ankle. Immediate pain lateral left foot and ankle. Couldn't bear weight initially. Pain level 2/10 at rest, up to 8/10 with ambulation, sharp. No swelling or bruising. No prior injuries here. Was wearing crocs when this happened. No skin changes, fever, other complaints.  Past Medical History  Diagnosis Date  . Thyroid disease   . Osteoporosis, unspecified   . Abdominal tenderness   . Transient disorder of initiating or maintaining sleep   . Anorexia nervosa   . Painful respiration   . Anxiety state, unspecified   . Attention deficit disorder with hyperactivity(314.01)   . Benign neoplasm of choroid     Right  . Mastodynia   . Borderline personality disorder   . Carbuncle and furuncle of trunk   . Contact dermatitis and other eczema, due to unspecified cause   . Contusion of knee   . Esophageal reflux   . 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 Unspecified hypothyroidism   . Swelling, mass, or lump in chest     Current Outpatient Prescriptions on File Prior to Visit  Medication Sig Dispense Refill  . famciclovir (FAMVIR) 500 MG tablet Take 500 mg by mouth 2 (two) times daily.    Marland Kitchen levothyroxine (SYNTHROID, LEVOTHROID) 88 MCG tablet Take 88 mcg by mouth daily.     No current facility-administered medications on file prior to visit.    Past Surgical History  Procedure Laterality Date  . Cholecystectomy    . Tonsillectomy    . Tonsillectomy    . Thyroid surgery      Allergies  Allergen Reactions  . Cephalexin Anaphylaxis  . Doxycycline Rash and Shortness Of Breath  . Erythromycin Base Other (See Comments) and Shortness Of Breath    GI Upset  . Azithromycin Other (See Comments)  . Bee Venom Other (See Comments)  .  Clarithromycin   . Codeine   . Darvon   . Extract Of [facility] Other (See Comments)  . Metronidazole Other (See Comments)    Mouth sores  . Nsaids Other (See Comments)    GI Upset  . Penicillins   . Sulfa Antibiotics   . Vitamin E Other (See Comments)  . Ivp Dye [Iodinated Diagnostic Agents] Nausea And Vomiting and Rash  . Prednisone Nausea And Vomiting and Rash    Social History   Social History  . Marital Status: Single    Spouse Name: N/A  . Number of Children: N/A  . Years of Education: N/A   Occupational History  . Not on file.   Social History Main Topics  . Smoking status: Never Smoker   . Smokeless tobacco: Not on file  . Alcohol Use: No  . Drug Use: No  . Sexual Activity: No   Other Topics Concern  . Not on file   Social History Narrative    No family history on file.  BP 114/80 mmHg  Pulse 81  Ht 5\' 1"  (1.549 m)  Wt 128 lb (58.06 kg)  BMI 24.20 kg/m2  Review of Systems: See HPI above.    Objective:  Physical Exam:  Gen: NAD, comfortable in exam room  Left foot/ankle: No gross deformity, swelling, ecchymoses FROM with pain on plantar flexion and external rotation  TTP 5th metatarsal and peroneal tendons. Negative ant drawer and talar tilt.   Negative syndesmotic compression. Thompsons test negative. NV intact distally.  Right foot/ankle: FROM without pain.    MSK u/s:  No cortical irregularity, edema overlying cortex, neovascularity overlying 5th metatarsal.  No visible tears of peroneal tendons.  Assessment & Plan:  1. Left foot injury - independently reviewed radiographs and MSK u/s - no evidence fracture or peroneal tendon tear.  Consistent with contusion, peroneal tendon strain.  Icing, hard soled shoe.  Norco as needed for severe pain.  Crutches if needed.  F/u in 2 weeks if not improving.

## 2015-09-09 NOTE — Assessment & Plan Note (Signed)
independently reviewed radiographs and MSK u/s - no evidence fracture or peroneal tendon tear.  Consistent with contusion, peroneal tendon strain.  Icing, hard soled shoe.  Norco as needed for severe pain.  Crutches if needed.  F/u in 2 weeks if not improving.

## 2015-09-25 ENCOUNTER — Ambulatory Visit (INDEPENDENT_AMBULATORY_CARE_PROVIDER_SITE_OTHER): Payer: Medicare Other | Admitting: Family Medicine

## 2015-09-25 ENCOUNTER — Encounter: Payer: Self-pay | Admitting: Family Medicine

## 2015-09-25 ENCOUNTER — Ambulatory Visit: Payer: Medicare Other | Admitting: Family Medicine

## 2015-09-25 VITALS — BP 92/64 | HR 71 | Ht 61.0 in | Wt 128.0 lb

## 2015-09-25 DIAGNOSIS — S99922D Unspecified injury of left foot, subsequent encounter: Secondary | ICD-10-CM

## 2015-09-25 DIAGNOSIS — S3992XA Unspecified injury of lower back, initial encounter: Secondary | ICD-10-CM | POA: Diagnosis present

## 2015-09-25 MED ORDER — DICLOFENAC SODIUM 75 MG PO TBEC: 75.0000 mg | DELAYED_RELEASE_TABLET | Freq: Two times a day (BID) | ORAL | Status: DC

## 2015-09-25 MED ORDER — CYCLOBENZAPRINE HCL 10 MG PO TABS: 10.0000 mg | ORAL_TABLET | Freq: Three times a day (TID) | ORAL | Status: DC | PRN

## 2015-09-25 NOTE — Patient Instructions (Signed)
Switch from the boot to a supportive shoe.  You're describing lumbar radiculopathy - disc herniation with a pinched nerve. Take tylenol for baseline pain relief (1-2 extra strength tabs 3x/day) Diclofenac twice a day with food for pain and inflammation. Flexeril as needed for muscle spasms (no driving on this medicine if it makes you sleepy). Stay as active as possible. Physical therapy has been shown to be helpful as well. Strengthening of low back muscles, abdominal musculature are key for long term pain relief. If not improving, will consider physical therapy, further imaging (MRI). Follow up with me in 1 month for reevaluation but call me sooner if you're having problems.

## 2015-09-26 DIAGNOSIS — S3992XA Unspecified injury of lower back, initial encounter: Secondary | ICD-10-CM | POA: Insufficient documentation

## 2015-09-26 NOTE — Assessment & Plan Note (Signed)
independently reviewed radiographs and MSK u/s last visit - no evidence fracture or peroneal tendon tear.  Clinically improved from contusion, peroneal tendon strain.  Can discontinue boot at this time to a supportive shoe.

## 2015-09-26 NOTE — Assessment & Plan Note (Signed)
describes lumbar radiculopathy but is improving since Thursday.  Diclofenac with flexeril as needed.  Home exercises.  Consider prednisone, physical therapy, imaging if not improving.  F/u in 1 month.

## 2015-09-26 NOTE — Progress Notes (Signed)
PCP: No primary care provider on file.  Subjective:   HPI: Patient is a 55 y.o. female here for left foot pain.  3/9: Patient reports 1 week ago she was walking in her backyard, stepped on a brick and inverted left ankle. Immediate pain lateral left foot and ankle. Couldn't bear weight initially. Pain level 2/10 at rest, up to 8/10 with ambulation, sharp. No swelling or bruising. No prior injuries here. Was wearing crocs when this happened. No skin changes, fever, other complaints.  3/29: Patient reports foot feels completely better. Still wearing boot. Pain level 0/10. Has been icing. Rarely needed norco. Reports having low back pain - worsened after picking up a thing of cat foot last Thursday. Pain was central low lumbar, sharp. Was bad for 2 days but has eased since then. Had radiation into left leg. No bowel/bladder dysfunction.  Past Medical History  Diagnosis Date  . Thyroid disease   . Osteoporosis, unspecified   . Abdominal tenderness   . Transient disorder of initiating or maintaining sleep   . Anorexia nervosa   . Painful respiration   . Anxiety state, unspecified   . Attention deficit disorder with hyperactivity(314.01)   . Benign neoplasm of choroid     Right  . Mastodynia   . Borderline personality disorder   . Carbuncle and furuncle of trunk   . Contact dermatitis and other eczema, due to unspecified cause   . Contusion of knee   . Esophageal reflux   . 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 Unspecified hypothyroidism   . Swelling, mass, or lump in chest     Current Outpatient Prescriptions on File Prior to Visit  Medication Sig Dispense Refill  . famciclovir (FAMVIR) 500 MG tablet Take 500 mg by mouth 2 (two) times daily.    Marland Kitchen levothyroxine (SYNTHROID, LEVOTHROID) 88 MCG tablet Take 88 mcg by mouth daily.    . midodrine (PROAMATINE) 5 MG tablet Take 5 mg by mouth.     No current facility-administered medications  on file prior to visit.    Past Surgical History  Procedure Laterality Date  . Cholecystectomy    . Tonsillectomy    . Tonsillectomy    . Thyroid surgery      Allergies  Allergen Reactions  . Cephalexin Anaphylaxis  . Doxycycline Rash and Shortness Of Breath  . Erythromycin Base Other (See Comments) and Shortness Of Breath    GI Upset  . Azithromycin Other (See Comments)  . Bee Venom Other (See Comments)  . Clarithromycin   . Codeine   . Darvon   . Extract Of [facility] Other (See Comments)  . Metronidazole Other (See Comments)    Mouth sores  . Nsaids Other (See Comments)    GI Upset  . Penicillins   . Sulfa Antibiotics   . Vitamin E Other (See Comments)  . Ivp Dye [Iodinated Diagnostic Agents] Nausea And Vomiting and Rash  . Prednisone Nausea And Vomiting and Rash    Social History   Social History  . Marital Status: Single    Spouse Name: N/A  . Number of Children: N/A  . Years of Education: N/A   Occupational History  . Not on file.   Social History Main Topics  . Smoking status: Never Smoker   . Smokeless tobacco: Not on file  . Alcohol Use: No  . Drug Use: No  . Sexual Activity: No   Other Topics Concern  . Not  on file   Social History Narrative    No family history on file.  BP 92/64 mmHg  Pulse 71  Ht 5\' 1"  (1.549 m)  Wt 128 lb (58.06 kg)  BMI 24.20 kg/m2  Review of Systems: See HPI above.    Objective:  Physical Exam:  Gen: NAD, comfortable in exam room  Left foot/ankle: No gross deformity, swelling, ecchymoses FROM without pain. No TTP 5th metatarsal and peroneal tendons now.  No other tenderness. Negative ant drawer and talar tilt.   Negative syndesmotic compression. Thompsons test negative. NV intact distally.  Right foot/ankle: FROM without pain.  Assessment & Plan:  1. Left foot injury - independently reviewed radiographs and [facility] u/s last visit - no evidence fracture or peroneal tendon tear.  Clinically improved from  contusion, peroneal tendon strain.  Can discontinue boot at this time to a supportive shoe.    2. Low back injury - describes lumbar radiculopathy but is improving since Thursday.  Diclofenac with flexeril as needed.  Home exercises.  Consider prednisone, physical therapy, imaging if not improving.  F/u in 1 month.

## 2015-10-03 DIAGNOSIS — J302 Other seasonal allergic rhinitis: Secondary | ICD-10-CM | POA: Diagnosis not present

## 2015-10-03 DIAGNOSIS — J42 Unspecified chronic bronchitis: Secondary | ICD-10-CM | POA: Diagnosis not present

## 2015-12-17 DIAGNOSIS — G629 Polyneuropathy, unspecified: Secondary | ICD-10-CM | POA: Diagnosis not present

## 2015-12-17 DIAGNOSIS — E034 Atrophy of thyroid (acquired): Secondary | ICD-10-CM | POA: Diagnosis not present

## 2015-12-17 DIAGNOSIS — F5102 Adjustment insomnia: Secondary | ICD-10-CM | POA: Diagnosis not present

## 2015-12-17 DIAGNOSIS — F5000 Anorexia nervosa, unspecified: Secondary | ICD-10-CM | POA: Diagnosis not present

## 2015-12-17 DIAGNOSIS — I471 Supraventricular tachycardia: Secondary | ICD-10-CM | POA: Diagnosis not present

## 2015-12-17 DIAGNOSIS — J410 Simple chronic bronchitis: Secondary | ICD-10-CM | POA: Diagnosis not present

## 2015-12-17 DIAGNOSIS — K219 Gastro-esophageal reflux disease without esophagitis: Secondary | ICD-10-CM | POA: Diagnosis not present

## 2015-12-17 DIAGNOSIS — M25552 Pain in left hip: Secondary | ICD-10-CM | POA: Diagnosis not present

## 2015-12-17 DIAGNOSIS — M818 Other osteoporosis without current pathological fracture: Secondary | ICD-10-CM | POA: Diagnosis not present

## 2015-12-17 DIAGNOSIS — Z1239 Encounter for other screening for malignant neoplasm of breast: Secondary | ICD-10-CM | POA: Diagnosis not present

## 2015-12-17 DIAGNOSIS — M222X2 Patellofemoral disorders, left knee: Secondary | ICD-10-CM | POA: Diagnosis not present

## 2015-12-17 DIAGNOSIS — E559 Vitamin D deficiency, unspecified: Secondary | ICD-10-CM | POA: Diagnosis not present

## 2015-12-18 DIAGNOSIS — Z0000 Encounter for general adult medical examination without abnormal findings: Secondary | ICD-10-CM | POA: Diagnosis not present

## 2015-12-18 DIAGNOSIS — I471 Supraventricular tachycardia: Secondary | ICD-10-CM | POA: Diagnosis not present

## 2015-12-18 DIAGNOSIS — M25552 Pain in left hip: Secondary | ICD-10-CM | POA: Diagnosis not present

## 2015-12-18 DIAGNOSIS — K219 Gastro-esophageal reflux disease without esophagitis: Secondary | ICD-10-CM | POA: Diagnosis not present

## 2015-12-18 DIAGNOSIS — E559 Vitamin D deficiency, unspecified: Secondary | ICD-10-CM | POA: Diagnosis not present

## 2015-12-18 DIAGNOSIS — G629 Polyneuropathy, unspecified: Secondary | ICD-10-CM | POA: Diagnosis not present

## 2015-12-18 DIAGNOSIS — J410 Simple chronic bronchitis: Secondary | ICD-10-CM | POA: Diagnosis not present

## 2015-12-18 DIAGNOSIS — M818 Other osteoporosis without current pathological fracture: Secondary | ICD-10-CM | POA: Diagnosis not present

## 2015-12-18 DIAGNOSIS — F5000 Anorexia nervosa, unspecified: Secondary | ICD-10-CM | POA: Diagnosis not present

## 2015-12-18 DIAGNOSIS — Z13220 Encounter for screening for lipoid disorders: Secondary | ICD-10-CM | POA: Diagnosis not present

## 2015-12-18 DIAGNOSIS — Z136 Encounter for screening for cardiovascular disorders: Secondary | ICD-10-CM | POA: Diagnosis not present

## 2015-12-18 DIAGNOSIS — E039 Hypothyroidism, unspecified: Secondary | ICD-10-CM | POA: Diagnosis not present

## 2016-02-04 DIAGNOSIS — Z1231 Encounter for screening mammogram for malignant neoplasm of breast: Secondary | ICD-10-CM | POA: Diagnosis not present

## 2016-02-04 DIAGNOSIS — Z780 Asymptomatic menopausal state: Secondary | ICD-10-CM | POA: Diagnosis not present

## 2016-02-04 DIAGNOSIS — M810 Age-related osteoporosis without current pathological fracture: Secondary | ICD-10-CM | POA: Diagnosis not present

## 2016-02-25 DIAGNOSIS — N951 Menopausal and female climacteric states: Secondary | ICD-10-CM | POA: Insufficient documentation

## 2016-02-25 DIAGNOSIS — M818 Other osteoporosis without current pathological fracture: Secondary | ICD-10-CM | POA: Diagnosis not present

## 2016-02-25 DIAGNOSIS — F331 Major depressive disorder, recurrent, moderate: Secondary | ICD-10-CM | POA: Diagnosis not present

## 2016-02-25 DIAGNOSIS — J410 Simple chronic bronchitis: Secondary | ICD-10-CM | POA: Diagnosis not present

## 2016-02-25 DIAGNOSIS — B009 Herpesviral infection, unspecified: Secondary | ICD-10-CM | POA: Diagnosis not present

## 2016-02-25 DIAGNOSIS — H8110 Benign paroxysmal vertigo, unspecified ear: Secondary | ICD-10-CM | POA: Diagnosis not present

## 2016-02-25 DIAGNOSIS — B9789 Other viral agents as the cause of diseases classified elsewhere: Secondary | ICD-10-CM | POA: Diagnosis not present

## 2016-02-25 DIAGNOSIS — J069 Acute upper respiratory infection, unspecified: Secondary | ICD-10-CM | POA: Diagnosis not present

## 2016-03-04 ENCOUNTER — Encounter: Payer: Self-pay | Admitting: Family Medicine

## 2016-03-04 ENCOUNTER — Ambulatory Visit (INDEPENDENT_AMBULATORY_CARE_PROVIDER_SITE_OTHER): Payer: Medicare Other | Admitting: Family Medicine

## 2016-03-04 ENCOUNTER — Encounter (INDEPENDENT_AMBULATORY_CARE_PROVIDER_SITE_OTHER): Payer: Self-pay

## 2016-03-04 DIAGNOSIS — M722 Plantar fascial fibromatosis: Secondary | ICD-10-CM | POA: Diagnosis not present

## 2016-03-04 NOTE — Patient Instructions (Signed)
You have plantar fasciitis Take tylenol or aleve as needed for pain  Plantar fascia stretch for 20-30 seconds (do 3 of these) in morning Lowering/raise on a step exercises 3 x 10 once or twice a day - this is very important for long term recovery. Can add heel walks, toe walks forward and backward as well Ice heel for 15 minutes as needed. Avoid flat shoes/barefoot walking as much as possible. Arch straps have been shown to help with pain. Try sports insoles or dr scholls active series insoles. Steroid injection is a consideration for short term pain relief if you are struggling. Physical therapy is also an option. Follow up with me in 1 month.

## 2016-03-06 DIAGNOSIS — M722 Plantar fascial fibromatosis: Secondary | ICD-10-CM | POA: Insufficient documentation

## 2016-03-06 DIAGNOSIS — R0789 Other chest pain: Secondary | ICD-10-CM | POA: Diagnosis not present

## 2016-03-06 NOTE — Progress Notes (Signed)
PCP: No primary care provider on file.  Subjective:   HPI: Patient is a 55 y.o. female here for right heel pain.  Patient reports she's had pain on plantar, lateral right heel for about 6 weeks now. No acute injury or trauma. Questionable swelling in this area. Pain is 6/10, worse with walking and other activities, sharp. No skin changes, numbness.  Past Medical History:  Diagnosis Date  . Abdominal tenderness   . Anorexia nervosa   . Anxiety state, unspecified   . Attention deficit disorder with hyperactivity(314.01)   . Benign neoplasm of choroid    Right  . Borderline personality disorder   . Carbuncle and furuncle of trunk   . Contact dermatitis and other eczema, due to unspecified cause   . Contusion of knee   . Esophageal reflux   . Mastodynia   . Osteoporosis, unspecified   . Painful respiration   . Swelling, mass, or lump in chest   . Thyroid disease   . 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 Transient disorder of initiating or maintaining sleep   . Unspecified hypothyroidism     Current Outpatient Prescriptions on File Prior to Visit  Medication Sig Dispense Refill  . famciclovir (FAMVIR) 500 MG tablet Take 500 mg by mouth 2 (two) times daily.    Marland Kitchen levothyroxine (SYNTHROID, LEVOTHROID) 88 MCG tablet Take 88 mcg by mouth daily.     No current facility-administered medications on file prior to visit.     Past Surgical History:  Procedure Laterality Date  . CHOLECYSTECTOMY    . THYROID SURGERY    . TONSILLECTOMY    . TONSILLECTOMY      Allergies  Allergen Reactions  . Cephalexin Anaphylaxis  . Doxycycline Rash and Shortness Of Breath  . Erythromycin Base Other (See Comments) and Shortness Of Breath    GI Upset  . Azithromycin Other (See Comments)  . Bee Venom Other (See Comments)  . Clarithromycin   . Codeine   . Darvon   . Extract Of [facility] Other (See Comments)  . Metronidazole Other (See Comments)    Mouth  sores  . Nsaids Other (See Comments)    GI Upset  . Penicillins   . Sulfa Antibiotics   . Vitamin E Other (See Comments)  . Ivp Dye [Iodinated Diagnostic Agents] Nausea And Vomiting, Rash and Other (See Comments)    Dyspnea  . Prednisone Nausea And Vomiting and Rash    Social History   Social History  . Marital status: Single    Spouse name: N/A  . Number of children: N/A  . Years of education: N/A   Occupational History  . Not on file.   Social History Main Topics  . Smoking status: Never Smoker  . Smokeless tobacco: Never Used  . Alcohol use No  . Drug use: No  . Sexual activity: No   Other Topics Concern  . Not on file   Social History Narrative  . No narrative on file    No family history on file.  BP 90/63   Pulse 78   Ht 5\' 1"  (1.549 m)   Wt 133 lb (60.3 kg)   BMI 25.13 kg/m   Review of Systems: See HPI above.    Objective:  Physical Exam:  Gen: NAD, comfortable in exam room  Right foot/ankle: No gross deformity, swelling, ecchymoses FROM ankle with 5/5 strength all directions. TTP plantar calcaneus laterally at PF insertion.  No  other tenderness of foot or ankle. Negative ant drawer and talar tilt.   Negative syndesmotic compression. Negative calcaneal squeeze. Thompsons test negative. NV intact distally.  Left foot/ankle: FROM without pain.    Assessment & Plan:  1. Right plantar fasciitis - shown home exercises and stretches to do daily.  Arch binders, otc orthotics.  Icing, tylenol or aleve if needed.  Consider PT, injection if not improving.  F/u in 1 month.

## 2016-03-06 NOTE — Assessment & Plan Note (Signed)
shown home exercises and stretches to do daily.  Arch binders, otc orthotics.  Icing, tylenol or aleve if needed.  Consider PT, injection if not improving.  F/u in 1 month.

## 2016-03-17 ENCOUNTER — Emergency Department (HOSPITAL_BASED_OUTPATIENT_CLINIC_OR_DEPARTMENT_OTHER): Payer: Medicare Other

## 2016-03-17 ENCOUNTER — Emergency Department (HOSPITAL_BASED_OUTPATIENT_CLINIC_OR_DEPARTMENT_OTHER)
Admission: EM | Admit: 2016-03-17 | Discharge: 2016-03-17 | Disposition: A | Discharge status: Home / Self Care | Payer: Medicare Other | Attending: Emergency Medicine | Admitting: Emergency Medicine

## 2016-03-17 ENCOUNTER — Encounter (HOSPITAL_BASED_OUTPATIENT_CLINIC_OR_DEPARTMENT_OTHER): Payer: Self-pay

## 2016-03-17 DIAGNOSIS — S8992XA Unspecified injury of left lower leg, initial encounter: Secondary | ICD-10-CM | POA: Diagnosis present

## 2016-03-17 DIAGNOSIS — Y999 Unspecified external cause status: Secondary | ICD-10-CM | POA: Insufficient documentation

## 2016-03-17 DIAGNOSIS — S82032A Displaced transverse fracture of left patella, initial encounter for closed fracture: Secondary | ICD-10-CM | POA: Insufficient documentation

## 2016-03-17 DIAGNOSIS — F909 Attention-deficit hyperactivity disorder, unspecified type: Secondary | ICD-10-CM | POA: Diagnosis not present

## 2016-03-17 DIAGNOSIS — R42 Dizziness and giddiness: Secondary | ICD-10-CM | POA: Diagnosis not present

## 2016-03-17 DIAGNOSIS — S4992XA Unspecified injury of left shoulder and upper arm, initial encounter: Secondary | ICD-10-CM | POA: Diagnosis not present

## 2016-03-17 DIAGNOSIS — S43402A Unspecified sprain of left shoulder joint, initial encounter: Secondary | ICD-10-CM | POA: Diagnosis not present

## 2016-03-17 DIAGNOSIS — Y939 Activity, unspecified: Secondary | ICD-10-CM | POA: Insufficient documentation

## 2016-03-17 DIAGNOSIS — W1839XA Other fall on same level, initial encounter: Secondary | ICD-10-CM | POA: Insufficient documentation

## 2016-03-17 DIAGNOSIS — M25512 Pain in left shoulder: Secondary | ICD-10-CM | POA: Diagnosis not present

## 2016-03-17 DIAGNOSIS — S82002A Unspecified fracture of left patella, initial encounter for closed fracture: Secondary | ICD-10-CM

## 2016-03-17 DIAGNOSIS — Y929 Unspecified place or not applicable: Secondary | ICD-10-CM | POA: Diagnosis not present

## 2016-03-17 DIAGNOSIS — S60221A Contusion of right hand, initial encounter: Secondary | ICD-10-CM | POA: Insufficient documentation

## 2016-03-17 DIAGNOSIS — M25531 Pain in right wrist: Secondary | ICD-10-CM | POA: Diagnosis not present

## 2016-03-17 DIAGNOSIS — S6991XA Unspecified injury of right wrist, hand and finger(s), initial encounter: Secondary | ICD-10-CM | POA: Diagnosis not present

## 2016-03-17 HISTORY — DX: Syncope and collapse: R55

## 2016-03-17 MED ORDER — ONDANSETRON HCL 4 MG PO TABS: 4.0000 mg | ORAL_TABLET | Freq: Four times a day (QID) | ORAL | 0 refills | Status: DC | PRN

## 2016-03-17 MED ORDER — HYDROCODONE-ACETAMINOPHEN 5-325 MG PO TABS
1.0000 | ORAL_TABLET | Freq: Once | ORAL | Status: AC
Start: 2016-03-17 — End: 2016-03-17
  Administered 2016-03-17: 1 via ORAL
  Filled 2016-03-17: qty 1

## 2016-03-17 MED ORDER — ONDANSETRON 4 MG PO TBDP
4.0000 mg | ORAL_TABLET | Freq: Once | ORAL | Status: AC
  Administered 2016-03-17: 4 mg via ORAL
  Filled 2016-03-17: qty 1

## 2016-03-17 MED ORDER — HYDROCODONE-ACETAMINOPHEN 5-325 MG PO TABS: 1.0000 | ORAL_TABLET | Freq: Four times a day (QID) | ORAL | 0 refills | Status: DC | PRN

## 2016-03-17 NOTE — ED Provider Notes (Signed)
[facility] Provider Note   CSN: [id number] Arrival date & time: 03/17/16  1232     History   Chief Complaint Chief Complaint  Patient presents with  . Fall    HPI Melanie Hartman is a 55 y.o. female.  HPI Patient presents after a trip and fall from standing landing on her left knee and right outstretched wrist. Denied any head or neck injury. She also is complaining of pain to the left shoulder at this time. No numbness or weakness. Patient states that she had a brief near syncopal episode after the fall. Has a history of vasovagal syndrome and states that the pain from her knee caused her near-syncope. She denies any lightheadedness or dizziness at this time. No palpitations or chest pain. Past Medical History:  Diagnosis Date  . Abdominal tenderness   . Anorexia nervosa   . Anxiety state, unspecified   . Attention deficit disorder with hyperactivity(314.01)   . Benign neoplasm of choroid    Right  . Borderline personality disorder   . Carbuncle and furuncle of trunk   . Contact dermatitis and other eczema, due to unspecified cause   . Contusion of knee   . Esophageal reflux   . Mastodynia   . Osteoporosis, unspecified   . Painful respiration   . Swelling, mass, or lump in chest   . Thyroid disease   . 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 Transient disorder of initiating or maintaining sleep   . Unspecified hypothyroidism   . Vasovagal syndrome     Patient Active Problem List   Diagnosis Date Noted  . Plantar fasciitis of right foot 03/06/2016  . Female climacteric state 02/25/2016  . Lower back injury 09/26/2015  . Injury of left foot 09/09/2015  . Head revolving around 03/21/2014  . Arthritis, degenerative 03/21/2014  . Avitaminosis D 02/11/2014  . Syncope and collapse 02/11/2014  . Breath shortness 02/11/2014  . Abnormal auditory perception 02/11/2014  . Allergic rhinitis 02/11/2014  . Ectopic atrial  tachycardia ([place]) 02/11/2014  . Neuropathy ([place]) 02/11/2014  . OP (osteoporosis) 02/11/2014  . Adjustment insomnia 11/28/2013  . AA (alcohol abuse) 11/28/2013  . AN (anorexia nervosa) 11/28/2013  . Anxiety 11/28/2013  . ADD (attention deficit disorder) 11/28/2013  . [place]ow disease 11/28/2013  . Benign neoplasm of choroid 11/28/2013  . Borderline personality disorder 11/28/2013  . Lesion of lung 11/28/2013  . CD (contact dermatitis) 11/28/2013  . Clinical depression 11/28/2013  . Acid reflux 11/28/2013  . Adult hypothyroidism 11/28/2013  . Headache, migraine 11/28/2013  . Panic disorder without agoraphobia 11/28/2013    Past Surgical History:  Procedure Laterality Date  . CHOLECYSTECTOMY    . THYROID SURGERY    . TONSILLECTOMY    . TONSILLECTOMY      OB History    No data available       Home Medications    Prior to Admission medications   Medication Sig Start Date End Date Taking? Authorizing Provider  famciclovir (FAMVIR) 500 MG tablet Take 500 mg by mouth 2 (two) times daily.    Historical Provider, MD  famotidine (PEPCID) 20 MG tablet Take 20 mg by mouth.    Historical Provider, MD  fluticasone (FLONASE) 50 MCG/ACT nasal spray 2 sprays by Each Nare route daily as needed.     Historical Provider, MD  HYDROcodone-acetaminophen (NORCO) 5-325 MG tablet Take 1-2 tablets by mouth every 6 (six) hours as needed for severe pain.  03/17/16   Julianne Rice, MD  ibandronate (BONIVA) 150 MG tablet Take 150 mg by mouth. 02/25/16   Historical Provider, MD  levothyroxine (SYNTHROID, LEVOTHROID) 88 MCG tablet Take 88 mcg by mouth daily.    Historical Provider, MD  midodrine (PROAMATINE) 5 MG tablet TAKE ONE TABLET BY MOUTH THREE TIMES DAILY 02/10/16   Historical Provider, MD  ondansetron (ZOFRAN) 4 MG tablet Take 1 tablet (4 mg total) by mouth every 6 (six) hours as needed for nausea or vomiting. 03/17/16   Julianne Rice, MD    Family History No family history on file.  Social  History Social History  Substance Use Topics  . Smoking status: Never Smoker  . Smokeless tobacco: Never Used  . Alcohol use No     Allergies   Cephalexin; Doxycycline; Erythromycin base; Azithromycin; Bee venom; Clarithromycin; Codeine; Darvon; Extract of poison oak; Metronidazole; Nsaids; Penicillins; Sulfa antibiotics; Vitamin e; Ivp dye [iodinated diagnostic agents]; and Prednisone   Review of Systems Review of Systems  Constitutional: Negative for chills and fever.  HENT: Negative for facial swelling.   Respiratory: Negative for shortness of breath.   Cardiovascular: Negative for chest pain.  Gastrointestinal: Negative for abdominal pain, diarrhea, nausea and vomiting.  Musculoskeletal: Positive for arthralgias, joint swelling and myalgias. Negative for back pain, neck pain and neck stiffness.  Skin: Positive for wound. Negative for rash.  Neurological: Positive for dizziness and light-headedness. Negative for weakness, numbness and headaches.  Psychiatric/Behavioral: The patient is nervous/anxious.   All other systems reviewed and are negative.    Physical Exam Updated Vital Signs BP 95/71 (BP Location: Left Arm)   Pulse (!) 55   Temp 98.4 F (36.9 C) (Oral)   Resp 16   SpO2 100%   Physical Exam  Constitutional: She is oriented to person, place, and time. She appears well-developed and well-nourished.  HENT:  Head: Normocephalic and atraumatic.  Mouth/Throat: Oropharynx is clear and moist.  No scalp or facial trauma. Midface is stable. No malocclusion.  Eyes: EOM are normal. Pupils are equal, round, and reactive to light.  Neck: Normal range of motion. Neck supple.  No posterior midline cervical tenderness to palpation.  Cardiovascular: Normal rate, regular rhythm and normal heart sounds.  Exam reveals no gallop and no friction rub.   No murmur heard. Pulmonary/Chest: Effort normal and breath sounds normal. No respiratory distress. She has no wheezes. She has no  rales. She exhibits no tenderness.  Abdominal: Soft. Bowel sounds are normal. There is no tenderness. There is no rebound and no guarding.  Musculoskeletal: She exhibits edema and tenderness.  Decreased range of motion to the left needed pain. No ligamentous instability. Patient does have contusion over the anterior surface of the patella. She has tears to palpation over the medial surface of the knee. 2+ distal pulses. There is a contusion to the thenar eminence of the right hand. Tenderness over the palmar surface of the right wrist. No snuffbox tenderness. No obvious deformity. Good distal cap refill. Patient has tenderness to palpation over the superior surface of the left shoulder. Full range of motion without obvious deformity.  Neurological: She is alert and oriented to person, place, and time.  5/5 motor in all extremities. Sensation is fully intact.  Skin: Skin is warm and dry. Capillary refill takes less than 2 seconds. No rash noted. No erythema.  Patient has small abrasion to the medial surface of the right thigh.  Psychiatric: She has a normal mood and affect. Her behavior is  normal.  Nursing note and vitals reviewed.    ED Treatments / Results  Labs (all labs ordered are listed, but only abnormal results are displayed) Labs Reviewed - No data to display  EKG  EKG Interpretation  Date/Time:  Tuesday March 17 2016 13:15:01 EDT Ventricular Rate:  56 PR Interval:    QRS Duration: 86 QT Interval:  423 QTC Calculation: 409 R Axis:   32 Text Interpretation:  Sinus rhythm Confirmed by Lita Mains  MD, Adrionna Delcid ([id number]) on 03/17/2016 1:25:55 PM Also confirmed by Lita Mains  MD, Treven Holtman ([id number]), editor [place], Leda Gauze [phone])  on 03/17/2016 2:04:50 PM       Radiology Dg Wrist Complete Right  Result Date: 03/17/2016 CLINICAL DATA:  55 year old female status post fall with pain. Initial encounter. EXAM: RIGHT WRIST - COMPLETE 3+ VIEW COMPARISON:  Right hand and wrist series 7  /31/2013 FINDINGS: Healed distal right fifth metacarpal fracture since the prior study. Distal radius and ulna appear stable and intact. Carpal bone alignment stable and within normal limits. No carpal bone fracture. Visible metacarpals appear intact. IMPRESSION: No acute fracture or dislocation identified about the right wrist. Electronically Signed   By: Genevie Ann M.D.   On: 03/17/2016 14:02   Dg Shoulder Left  Result Date: 03/17/2016 CLINICAL DATA:  Pain following fall EXAM: LEFT SHOULDER - 2+ VIEW COMPARISON:  None. FINDINGS: Frontal, Y scapular, and axillary images were obtained. There is no fracture or dislocation. The joint spaces appear unremarkable. No erosive change. Visualized left lung is clear. IMPRESSION: No fracture or dislocation.  No evident arthropathy. Electronically Signed   By: Lowella Grip III M.D.   On: 03/17/2016 14:00   Dg Knee Complete 4 Views Left  Result Date: 03/17/2016 CLINICAL DATA:  55 year old female status post fall with pain. Initial encounter. EXAM: LEFT KNEE - COMPLETE 4+ VIEW COMPARISON:  Left knee series 6 12/17/2007. FINDINGS: Osteopenia. Transverse oblique fracture through the patella best visualized on the lateral view. Minimally displaced. No associated joint effusion is evident. Distal left femur, proximal tibia and fibula appear stable and intact. Mild medial compartment joint space loss. IMPRESSION: Transverse, oblique minimally displaced fracture of the patella. Electronically Signed   By: Genevie Ann M.D.   On: 03/17/2016 14:00    Procedures Procedures (including critical care time)  Medications Ordered in ED Medications  HYDROcodone-acetaminophen (NORCO/VICODIN) 5-325 MG per tablet 1 tablet (1 tablet Oral Given 03/17/16 1311)  ondansetron (ZOFRAN-ODT) disintegrating tablet 4 mg (4 mg Oral Given 03/17/16 1356)     Initial Impression / Assessment and Plan / ED Course  I have reviewed the triage vital signs and the nursing notes.  Pertinent labs &  imaging results that were available during my care of the patient were reviewed by me and considered in my medical decision making (see chart for details).  Clinical Course  Discussed with Dr. Barbaraann Barthel. Recommends straight leg brace and weightbearing as tolerated. Follow-up in his clinic. Patient also has requested follow-up with hand surgery. I think given contact information. She was placed in a wrist splint for comfort. Discharged home with her daughter. Return precautions given.    Final Clinical Impressions(s) / ED Diagnoses   Final diagnoses:  Patella fracture, left, closed, initial encounter  Hand contusion, right, initial encounter  Shoulder sprain, left, initial encounter    New Prescriptions Discharge Medication List as of 03/17/2016  3:08 PM    START taking these medications   Details  HYDROcodone-acetaminophen (NORCO) 5-325 MG tablet Take 1-2 tablets  by mouth every 6 (six) hours as needed for severe pain., Starting Tue 03/17/2016, Print    ondansetron (ZOFRAN) 4 MG tablet Take 1 tablet (4 mg total) by mouth every 6 (six) hours as needed for nausea or vomiting., Starting Tue 03/17/2016, Print         Julianne Rice, MD 03/18/16 1511

## 2016-03-17 NOTE — Discharge Instructions (Signed)
Call to make an appointment to follow-up with Dr. Barbaraann Barthel in 1 week. Keep knee immobilizer in place. You may weight-bear as tolerated.

## 2016-03-17 NOTE — ED Notes (Signed)
Pt needed assistance getting from car to wheel chair.

## 2016-03-17 NOTE — ED Notes (Signed)
Family at bedside. 

## 2016-03-17 NOTE — ED Triage Notes (Signed)
Fell approx 1 hour PTA-pain to left knee and right wrist-presents to triage in w/c-was assisted from car to w/c by EMT

## 2016-03-18 ENCOUNTER — Ambulatory Visit: Payer: Medicare Other | Admitting: Family Medicine

## 2016-03-19 ENCOUNTER — Ambulatory Visit: Payer: Medicare Other | Admitting: Family Medicine

## 2016-03-24 ENCOUNTER — Ambulatory Visit (INDEPENDENT_AMBULATORY_CARE_PROVIDER_SITE_OTHER): Payer: Medicare Other | Admitting: Family Medicine

## 2016-03-24 ENCOUNTER — Ambulatory Visit (HOSPITAL_BASED_OUTPATIENT_CLINIC_OR_DEPARTMENT_OTHER)
Admission: RE | Admit: 2016-03-24 | Discharge: 2016-03-24 | Disposition: A | Discharge status: Home / Self Care | Payer: Medicare Other | Source: Ambulatory Visit | Attending: Family Medicine | Admitting: Family Medicine

## 2016-03-24 VITALS — BP 108/76 | HR 69 | Ht 61.0 in | Wt 132.0 lb

## 2016-03-24 DIAGNOSIS — X58XXXA Exposure to other specified factors, initial encounter: Secondary | ICD-10-CM | POA: Insufficient documentation

## 2016-03-24 DIAGNOSIS — S82002A Unspecified fracture of left patella, initial encounter for closed fracture: Secondary | ICD-10-CM

## 2016-03-24 DIAGNOSIS — S8992XA Unspecified injury of left lower leg, initial encounter: Secondary | ICD-10-CM | POA: Diagnosis not present

## 2016-03-24 DIAGNOSIS — R0781 Pleurodynia: Secondary | ICD-10-CM

## 2016-03-24 DIAGNOSIS — S20212A Contusion of left front wall of thorax, initial encounter: Secondary | ICD-10-CM | POA: Diagnosis not present

## 2016-03-24 MED ORDER — TRAMADOL HCL 50 MG PO TABS: 50.0000 mg | ORAL_TABLET | Freq: Four times a day (QID) | ORAL | 0 refills | Status: AC | PRN

## 2016-03-24 MED ORDER — CYCLOBENZAPRINE HCL 10 MG PO TABS: 10.0000 mg | ORAL_TABLET | Freq: Three times a day (TID) | ORAL | 1 refills | Status: DC | PRN

## 2016-03-24 NOTE — Patient Instructions (Signed)
Continue the immobilizer as you have been. Ibuprofen with tramadol and flexeril as needed. Icing 15 minutes at a time 3-4 times a day. Follow up with me in 2 weeks for reevaluation, to repeat your knee x-rays.

## 2016-03-26 ENCOUNTER — Encounter: Payer: Self-pay | Admitting: Family Medicine

## 2016-03-26 ENCOUNTER — Telehealth: Payer: Self-pay | Admitting: *Deleted

## 2016-03-26 DIAGNOSIS — S20212A Contusion of left front wall of thorax, initial encounter: Secondary | ICD-10-CM | POA: Insufficient documentation

## 2016-03-26 DIAGNOSIS — S82002A Unspecified fracture of left patella, initial encounter for closed fracture: Secondary | ICD-10-CM | POA: Insufficient documentation

## 2016-03-26 NOTE — Assessment & Plan Note (Signed)
independently reviewed radiographs - no displacement of her transverse patellar fracture.  Continue with immobilizer.  Ibuprofen with tramadol and flexeril as needed.  Icing.  F/u in 2 weeks, repeat x-rays at that time.

## 2016-03-26 NOTE — Telephone Encounter (Signed)
Pharmacy called related to filling Rx ondansetron (ZOFRAN) 4 MG tablet written 9/19.  EDCM advised that they should follow their policy guidelines for filling Rx.

## 2016-03-26 NOTE — Assessment & Plan Note (Signed)
independently reviewed radiographs and no fracture, pneumothorax.  Reassured patient.  Icing, ibuprofen, tramadol, flexeril.

## 2016-03-26 NOTE — Progress Notes (Signed)
PCP: No primary care provider on file.  Subjective:   HPI: Patient is a 55 y.o. female here for left knee injury.  Patient reports on 9/19 she tripped and fell forward directly onto her left knee and left side of chest. Immediate pain, difficulty bending her knee, walking. Pain is 2/10 in knee - wearing immobilizer, sharp anterior pain. Pain in left side of rib cage is 3/10 level, also sharp. Worse with a deep breath, cough, sneeze, hiccups. No skin changes, numbness.  Past Medical History:  Diagnosis Date  . Abdominal tenderness   . Anorexia nervosa   . Anxiety state, unspecified   . Attention deficit disorder with hyperactivity(314.01)   . Benign neoplasm of choroid    Right  . Borderline personality disorder   . Carbuncle and furuncle of trunk   . Contact dermatitis and other eczema, due to unspecified cause   . Contusion of knee   . Esophageal reflux   . Mastodynia   . Osteoporosis, unspecified   . Painful respiration   . Swelling, mass, or lump in chest   . Thyroid disease   . 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 Transient disorder of initiating or maintaining sleep   . Unspecified hypothyroidism   . Vasovagal syndrome     Current Outpatient Prescriptions on File Prior to Visit  Medication Sig Dispense Refill  . famciclovir (FAMVIR) 500 MG tablet Take 500 mg by mouth 2 (two) times daily.    . famotidine (PEPCID) 20 MG tablet Take 20 mg by mouth.    . fluticasone (FLONASE) 50 MCG/ACT nasal spray 2 sprays by Each Nare route daily as needed.     . ibandronate (BONIVA) 150 MG tablet Take 150 mg by mouth.    . levothyroxine (SYNTHROID, LEVOTHROID) 88 MCG tablet Take 88 mcg by mouth daily.    . midodrine (PROAMATINE) 5 MG tablet TAKE ONE TABLET BY MOUTH THREE TIMES DAILY    . ondansetron (ZOFRAN) 4 MG tablet Take 1 tablet (4 mg total) by mouth every 6 (six) hours as needed for nausea or vomiting. 12 tablet 0   No current  facility-administered medications on file prior to visit.     Past Surgical History:  Procedure Laterality Date  . CHOLECYSTECTOMY    . THYROID SURGERY    . TONSILLECTOMY    . TONSILLECTOMY      Allergies  Allergen Reactions  . Cephalexin Anaphylaxis  . Doxycycline Rash and Shortness Of Breath  . Erythromycin Base Other (See Comments) and Shortness Of Breath    GI Upset  . Azithromycin Other (See Comments)  . Bee Venom Other (See Comments)  . Clarithromycin   . Codeine   . Darvon   . Extract Of [facility] Other (See Comments)  . Metronidazole Other (See Comments)    Mouth sores  . Nsaids Other (See Comments)    GI Upset  . Penicillins   . Sulfa Antibiotics   . Vitamin E Other (See Comments)  . Ivp Dye [Iodinated Diagnostic Agents] Nausea And Vomiting, Rash and Other (See Comments)    Dyspnea  . Prednisone Nausea And Vomiting and Rash    Social History   Social History  . Marital status: Single    Spouse name: N/A  . Number of children: N/A  . Years of education: N/A   Occupational History  . Not on file.   Social History Main Topics  . Smoking status: Never Smoker  .  Smokeless tobacco: Never Used  . Alcohol use No  . Drug use: No  . Sexual activity: Not on file   Other Topics Concern  . Not on file   Social History Narrative  . No narrative on file    No family history on file.  There were no vitals taken for this visit.  Review of Systems: See HPI above.    Objective:  Physical Exam:  Gen: NAD, comfortable in exam room  CV:  RRR no MRG Lungs: CTAB through all lung fields.   Chest:  No stepoffs palpated in area of pain lateral left ribcage.  No crepitus.  Pain on trunk rotation, some on modified bench press motion.  Left knee: No gross deformity, ecchymoses, swelling. TTP anterior patella.  No joint line tenderness. Full extension - did not test flexion with known fracture. Negative valgus/varus testing.  NV intact distally.     Assessment & Plan:  1. Left knee injury - independently reviewed radiographs - no displacement of her transverse patellar fracture.  Continue with immobilizer.  Ibuprofen with tramadol and flexeril as needed.  Icing.  F/u in 2 weeks, repeat x-rays at that time.  2. Left rib contusion - independently reviewed radiographs and no fracture, pneumothorax.  Reassured patient.  Icing, ibuprofen, tramadol, flexeril.

## 2016-03-31 ENCOUNTER — Telehealth: Payer: Self-pay | Admitting: Family Medicine

## 2016-03-31 NOTE — Telephone Encounter (Signed)
Spoke to patient and she knows she can't do PT for the knee at this point. She would like to do PT for the left shoulder pectoral strain. She would like to be able to use crutches.

## 2016-03-31 NOTE — Telephone Encounter (Signed)
Ok - it's fine to put in order for home health PT for that.

## 2016-03-31 NOTE — Telephone Encounter (Signed)
She wanted to see if she could get a script or order for her to get PT at her home as her injuries are keeping her at home and she normally has no one to take her.?  Please let her know, thanks.

## 2016-03-31 NOTE — Telephone Encounter (Signed)
We can try when she gets to the point that it's healed enough to do PT.  We will discuss at her visit on the 12th.  I don't want her doing PT for her knee yet.

## 2016-03-31 NOTE — Telephone Encounter (Signed)
Spoke to patient and told her that we will put in referral for home PT.

## 2016-04-01 ENCOUNTER — Telehealth: Payer: Self-pay | Admitting: Family Medicine

## 2016-04-01 ENCOUNTER — Ambulatory Visit: Payer: Medicare Other | Admitting: Family Medicine

## 2016-04-02 NOTE — Telephone Encounter (Signed)
Information sent

## 2016-04-07 DIAGNOSIS — M810 Age-related osteoporosis without current pathological fracture: Secondary | ICD-10-CM | POA: Diagnosis not present

## 2016-04-07 DIAGNOSIS — Z9181 History of falling: Secondary | ICD-10-CM | POA: Diagnosis not present

## 2016-04-07 DIAGNOSIS — S20212D Contusion of left front wall of thorax, subsequent encounter: Secondary | ICD-10-CM | POA: Diagnosis not present

## 2016-04-07 DIAGNOSIS — F419 Anxiety disorder, unspecified: Secondary | ICD-10-CM | POA: Diagnosis not present

## 2016-04-07 DIAGNOSIS — F603 Borderline personality disorder: Secondary | ICD-10-CM | POA: Diagnosis not present

## 2016-04-07 DIAGNOSIS — S43402D Unspecified sprain of left shoulder joint, subsequent encounter: Secondary | ICD-10-CM | POA: Diagnosis not present

## 2016-04-07 DIAGNOSIS — F4310 Post-traumatic stress disorder, unspecified: Secondary | ICD-10-CM | POA: Diagnosis not present

## 2016-04-07 DIAGNOSIS — S82032D Displaced transverse fracture of left patella, subsequent encounter for closed fracture with routine healing: Secondary | ICD-10-CM | POA: Diagnosis not present

## 2016-04-07 DIAGNOSIS — M722 Plantar fascial fibromatosis: Secondary | ICD-10-CM | POA: Diagnosis not present

## 2016-04-09 ENCOUNTER — Ambulatory Visit (INDEPENDENT_AMBULATORY_CARE_PROVIDER_SITE_OTHER): Payer: Medicare Other | Admitting: Family Medicine

## 2016-04-09 ENCOUNTER — Ambulatory Visit (HOSPITAL_BASED_OUTPATIENT_CLINIC_OR_DEPARTMENT_OTHER)
Admission: RE | Admit: 2016-04-09 | Discharge: 2016-04-09 | Disposition: A | Discharge status: Home / Self Care | Payer: Medicare Other | Source: Ambulatory Visit | Attending: Family Medicine | Admitting: Family Medicine

## 2016-04-09 ENCOUNTER — Encounter: Payer: Self-pay | Admitting: Family Medicine

## 2016-04-09 ENCOUNTER — Encounter (INDEPENDENT_AMBULATORY_CARE_PROVIDER_SITE_OTHER): Payer: Self-pay

## 2016-04-09 VITALS — BP 105/63 | HR 72 | Ht 61.0 in | Wt 132.0 lb

## 2016-04-09 DIAGNOSIS — M25561 Pain in right knee: Secondary | ICD-10-CM | POA: Diagnosis not present

## 2016-04-09 DIAGNOSIS — S82002D Unspecified fracture of left patella, subsequent encounter for closed fracture with routine healing: Secondary | ICD-10-CM

## 2016-04-09 DIAGNOSIS — S8992XA Unspecified injury of left lower leg, initial encounter: Secondary | ICD-10-CM | POA: Diagnosis not present

## 2016-04-09 DIAGNOSIS — M722 Plantar fascial fibromatosis: Secondary | ICD-10-CM

## 2016-04-09 NOTE — Patient Instructions (Addendum)
You are still dealing with plantar fasciitis but you also have a right knee contusion, pes bursitis of this knee. You can add physical therapy to both of these areas. I would consider compression, knee sleeve (or ACE wrap) when up and walking around. Wear the immobilizer for 1-2 more weeks - see me somewhere in that time frame when the kneecap is not tender to the touch for reevaluation, repeat x-rays.

## 2016-04-10 DIAGNOSIS — S43402D Unspecified sprain of left shoulder joint, subsequent encounter: Secondary | ICD-10-CM | POA: Diagnosis not present

## 2016-04-10 DIAGNOSIS — S82032D Displaced transverse fracture of left patella, subsequent encounter for closed fracture with routine healing: Secondary | ICD-10-CM | POA: Diagnosis not present

## 2016-04-10 DIAGNOSIS — M722 Plantar fascial fibromatosis: Secondary | ICD-10-CM | POA: Diagnosis not present

## 2016-04-10 DIAGNOSIS — M810 Age-related osteoporosis without current pathological fracture: Secondary | ICD-10-CM | POA: Diagnosis not present

## 2016-04-10 DIAGNOSIS — F603 Borderline personality disorder: Secondary | ICD-10-CM | POA: Diagnosis not present

## 2016-04-10 DIAGNOSIS — S20212D Contusion of left front wall of thorax, subsequent encounter: Secondary | ICD-10-CM | POA: Diagnosis not present

## 2016-04-13 ENCOUNTER — Telehealth: Payer: Self-pay | Admitting: Family Medicine

## 2016-04-13 DIAGNOSIS — S83206A Unspecified tear of unspecified meniscus, current injury, right knee, initial encounter: Secondary | ICD-10-CM | POA: Insufficient documentation

## 2016-04-13 DIAGNOSIS — M25561 Pain in right knee: Secondary | ICD-10-CM | POA: Insufficient documentation

## 2016-04-13 DIAGNOSIS — S8001XD Contusion of right knee, subsequent encounter: Secondary | ICD-10-CM | POA: Insufficient documentation

## 2016-04-13 NOTE — Assessment & Plan Note (Signed)
2/2 pes bursitis from overuse.  Discussed consideration of adding physical therapy for this.  Should improve as left knee patella fracture improves.

## 2016-04-13 NOTE — Progress Notes (Signed)
PCP: PAYNE, Beverly Gust, NP  Subjective:   HPI: Patient is a 55 y.o. female here for left knee injury.  9/26: Patient reports on 9/19 she tripped and fell forward directly onto her left knee and left side of chest. Immediate pain, difficulty bending her knee, walking. Pain is 2/10 in knee - wearing immobilizer, sharp anterior pain. Pain in left side of rib cage is 3/10 level, also sharp. Worse with a deep breath, cough, sneeze, hiccups. No skin changes, numbness.  10/12: Patient reports she still gets some left knee pain at nighttime. Dealing with plantar right foot pain at 3/10 level with mild tenderness and anteromedial right knee pain at 2/10 level from walking a lot on this one. Wearing immobilizer on left knee. Still with pain anterior left chest - starting physical therapy for this. No skin changes, numbness.  Past Medical History:  Diagnosis Date  . Abdominal tenderness   . Anorexia nervosa   . Anxiety state, unspecified   . Attention deficit disorder with hyperactivity(314.01)   . Benign neoplasm of choroid    Right  . Borderline personality disorder   . Carbuncle and furuncle of trunk   . Contact dermatitis and other eczema, due to unspecified cause   . Contusion of knee   . Esophageal reflux   . Mastodynia   . Osteoporosis, unspecified   . Painful respiration   . Swelling, mass, or lump in chest   . Thyroid disease   . 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 Transient disorder of initiating or maintaining sleep   . Unspecified hypothyroidism   . Vasovagal syndrome     Current Outpatient Prescriptions on File Prior to Visit  Medication Sig Dispense Refill  . cyclobenzaprine (FLEXERIL) 10 MG tablet Take 1 tablet (10 mg total) by mouth 3 (three) times daily as needed for muscle spasms. 60 tablet 1  . famciclovir (FAMVIR) 500 MG tablet Take 500 mg by mouth 2 (two) times daily.    . famotidine (PEPCID) 20 MG tablet Take 20 mg by mouth.     . fluticasone (FLONASE) 50 MCG/ACT nasal spray 2 sprays by Each Nare route daily as needed.     . ibandronate (BONIVA) 150 MG tablet Take 150 mg by mouth.    . levothyroxine (SYNTHROID, LEVOTHROID) 88 MCG tablet Take 88 mcg by mouth daily.    . midodrine (PROAMATINE) 5 MG tablet TAKE ONE TABLET BY MOUTH THREE TIMES DAILY    . ondansetron (ZOFRAN) 4 MG tablet Take 1 tablet (4 mg total) by mouth every 6 (six) hours as needed for nausea or vomiting. 12 tablet 0  . traMADol (ULTRAM) 50 MG tablet Take 1 tablet (50 mg total) by mouth every 6 (six) hours as needed. 60 tablet 0   No current facility-administered medications on file prior to visit.     Past Surgical History:  Procedure Laterality Date  . CHOLECYSTECTOMY    . THYROID SURGERY    . TONSILLECTOMY    . TONSILLECTOMY      Allergies  Allergen Reactions  . Cephalexin Anaphylaxis  . Doxycycline Rash and Shortness Of Breath  . Erythromycin Base Other (See Comments) and Shortness Of Breath    GI Upset  . Azithromycin Other (See Comments)  . Bee Venom Other (See Comments)  . Clarithromycin   . Codeine   . Darvon   . Metronidazole Other (See Comments)    Mouth sores  . Nsaids Other (See Comments)  GI Upset  . Penicillins   . Poison Oak Extract Other (See Comments)  . Sulfa Antibiotics   . Vitamin E Other (See Comments)  . Ivp Dye [Iodinated Diagnostic Agents] Nausea And Vomiting, Rash and Other (See Comments)    Dyspnea  . Prednisone Nausea And Vomiting and Rash    Social History   Social History  . Marital status: Single    Spouse name: N/A  . Number of children: N/A  . Years of education: N/A   Occupational History  . Not on file.   Social History Main Topics  . Smoking status: Never Smoker  . Smokeless tobacco: Never Used  . Alcohol use No  . Drug use: No  . Sexual activity: Not on file   Other Topics Concern  . Not on file   Social History Narrative  . No narrative on file    No family history  on file.  BP 105/63   Pulse 72   Ht 5\' 1"  (1.549 m)   Wt 132 lb (59.9 kg)   BMI 24.94 kg/m   Review of Systems: See HPI above.    Objective:  Physical Exam:  Gen: NAD, comfortable in exam room  Left knee: No gross deformity, ecchymoses, swelling. Mild TTP anterior patella.  No joint line tenderness. Full extension - did not test flexion with known fracture. Negative valgus/varus testing.  NV intact distally.  Right knee: No gross deformity, ecchymoses, swelling. Mild TTP pes bursa.  No other tenderness including joint lines. FROM. Negative ant/post drawers. Negative valgus/varus testing. Negative lachmanns. Negative mcmurrays, apleys, patellar apprehension. NV intact distally.  Right foot/ankle: No gross deformity, swelling, ecchymoses FROM TTP medial calcaneus at plantar fascia insertion. Negative ant drawer and talar tilt.   Negative syndesmotic compression. Thompsons test negative. NV intact distally.    Assessment & Plan:  1. Left knee injury - independently reviewed radiographs - no displacement of her transverse patellar fracture and interval healing seen.  Continue with immobilizer and f/u in 1-2 weeks - likely transition to knee brace if radiographs still appear good and no/minimal tenderness.  Ibuprofen with tramadol and flexeril as needed.  Icing.   2. Right knee pain - 2/2 pes bursitis from overuse.  Discussed consideration of adding physical therapy for this.  Should improve as left knee patella fracture improves.  3. Right plantar fasciitis - continue with home exercises, arch supports, arch binders.

## 2016-04-13 NOTE — Assessment & Plan Note (Signed)
continue with home exercises, arch supports, arch binders.

## 2016-04-13 NOTE — Assessment & Plan Note (Signed)
independently reviewed radiographs - no displacement of her transverse patellar fracture and interval healing seen.  Continue with immobilizer and f/u in 1-2 weeks - likely transition to knee brace if radiographs still appear good and no/minimal tenderness.  Ibuprofen with tramadol and flexeril as needed.  Icing.

## 2016-04-14 NOTE — Telephone Encounter (Signed)
Spoke to patient and placed sleeve up front to be picked up.

## 2016-04-15 DIAGNOSIS — S43402D Unspecified sprain of left shoulder joint, subsequent encounter: Secondary | ICD-10-CM | POA: Diagnosis not present

## 2016-04-15 DIAGNOSIS — M810 Age-related osteoporosis without current pathological fracture: Secondary | ICD-10-CM | POA: Diagnosis not present

## 2016-04-15 DIAGNOSIS — M722 Plantar fascial fibromatosis: Secondary | ICD-10-CM | POA: Diagnosis not present

## 2016-04-15 DIAGNOSIS — S20212D Contusion of left front wall of thorax, subsequent encounter: Secondary | ICD-10-CM | POA: Diagnosis not present

## 2016-04-15 DIAGNOSIS — F603 Borderline personality disorder: Secondary | ICD-10-CM | POA: Diagnosis not present

## 2016-04-15 DIAGNOSIS — S82032D Displaced transverse fracture of left patella, subsequent encounter for closed fracture with routine healing: Secondary | ICD-10-CM | POA: Diagnosis not present

## 2016-04-16 ENCOUNTER — Ambulatory Visit: Payer: Medicare Other | Admitting: Family Medicine

## 2016-04-17 DIAGNOSIS — F603 Borderline personality disorder: Secondary | ICD-10-CM | POA: Diagnosis not present

## 2016-04-17 DIAGNOSIS — M722 Plantar fascial fibromatosis: Secondary | ICD-10-CM | POA: Diagnosis not present

## 2016-04-17 DIAGNOSIS — S43402D Unspecified sprain of left shoulder joint, subsequent encounter: Secondary | ICD-10-CM | POA: Diagnosis not present

## 2016-04-17 DIAGNOSIS — S20212D Contusion of left front wall of thorax, subsequent encounter: Secondary | ICD-10-CM | POA: Diagnosis not present

## 2016-04-17 DIAGNOSIS — S82032D Displaced transverse fracture of left patella, subsequent encounter for closed fracture with routine healing: Secondary | ICD-10-CM | POA: Diagnosis not present

## 2016-04-17 DIAGNOSIS — M810 Age-related osteoporosis without current pathological fracture: Secondary | ICD-10-CM | POA: Diagnosis not present

## 2016-04-21 DIAGNOSIS — M722 Plantar fascial fibromatosis: Secondary | ICD-10-CM | POA: Diagnosis not present

## 2016-04-21 DIAGNOSIS — S20212D Contusion of left front wall of thorax, subsequent encounter: Secondary | ICD-10-CM | POA: Diagnosis not present

## 2016-04-21 DIAGNOSIS — S43402D Unspecified sprain of left shoulder joint, subsequent encounter: Secondary | ICD-10-CM | POA: Diagnosis not present

## 2016-04-21 DIAGNOSIS — F603 Borderline personality disorder: Secondary | ICD-10-CM | POA: Diagnosis not present

## 2016-04-21 DIAGNOSIS — M810 Age-related osteoporosis without current pathological fracture: Secondary | ICD-10-CM | POA: Diagnosis not present

## 2016-04-21 DIAGNOSIS — S82032D Displaced transverse fracture of left patella, subsequent encounter for closed fracture with routine healing: Secondary | ICD-10-CM | POA: Diagnosis not present

## 2016-04-22 ENCOUNTER — Ambulatory Visit (INDEPENDENT_AMBULATORY_CARE_PROVIDER_SITE_OTHER): Payer: Medicare Other | Admitting: Family Medicine

## 2016-04-22 ENCOUNTER — Encounter: Payer: Self-pay | Admitting: Family Medicine

## 2016-04-22 VITALS — BP 96/64 | HR 65 | Ht 61.0 in | Wt 135.0 lb

## 2016-04-22 DIAGNOSIS — S82092A Other fracture of left patella, initial encounter for closed fracture: Secondary | ICD-10-CM

## 2016-04-22 NOTE — Patient Instructions (Signed)
Switch to knee brace as soon as possible and out of the immobilizer. Use walker or crutches as you're transitioning out of this. Add physical therapy for this with focus on regaining quad strength. Follow up with me in 3-4 weeks for reevaluation. Start straight leg raises, knee extensions 3 sets of 10 while waiting on therapy.

## 2016-04-23 DIAGNOSIS — S20212D Contusion of left front wall of thorax, subsequent encounter: Secondary | ICD-10-CM | POA: Diagnosis not present

## 2016-04-23 DIAGNOSIS — F603 Borderline personality disorder: Secondary | ICD-10-CM | POA: Diagnosis not present

## 2016-04-23 DIAGNOSIS — M810 Age-related osteoporosis without current pathological fracture: Secondary | ICD-10-CM | POA: Diagnosis not present

## 2016-04-23 DIAGNOSIS — S43402D Unspecified sprain of left shoulder joint, subsequent encounter: Secondary | ICD-10-CM | POA: Diagnosis not present

## 2016-04-23 DIAGNOSIS — M722 Plantar fascial fibromatosis: Secondary | ICD-10-CM | POA: Diagnosis not present

## 2016-04-23 DIAGNOSIS — S82032D Displaced transverse fracture of left patella, subsequent encounter for closed fracture with routine healing: Secondary | ICD-10-CM | POA: Diagnosis not present

## 2016-04-24 ENCOUNTER — Telehealth: Payer: Self-pay | Admitting: Family Medicine

## 2016-04-24 NOTE — Telephone Encounter (Signed)
Called and gave information provided by physician.

## 2016-04-24 NOTE — Telephone Encounter (Signed)
She wanted to know if patient can bear weight on Left lower extremity, as tolerated?  She also needs verbal orders for PT to extend twice a week, for 3 weeks, starting week of Nov. 6th.  Thank you

## 2016-04-24 NOTE — Telephone Encounter (Signed)
Yes she can bear weight on left lower extremity as tolerated.  And it's ok to extend PT twice a week for 3 weeks starting 05/04/16

## 2016-04-26 NOTE — Assessment & Plan Note (Signed)
Clinically healed at this point - radiographs last visit with interval healing.  She is over 5 weeks out as well.  Switch to knee brace for support - advance weight bearing now and transition off crutches.  Add physical therapy for this.  Switch to voltaren gel.  F/u in 3-4 weeks.

## 2016-04-26 NOTE — Progress Notes (Signed)
PCP: PAYNE, Beverly Gust, NP  Subjective:   HPI: Patient is a 55 y.o. female here for left knee injury.  9/26: Patient reports on 9/19 she tripped and fell forward directly onto her left knee and left side of chest. Immediate pain, difficulty bending her knee, walking. Pain is 2/10 in knee - wearing immobilizer, sharp anterior pain. Pain in left side of rib cage is 3/10 level, also sharp. Worse with a deep breath, cough, sneeze, hiccups. No skin changes, numbness.  10/12: Patient reports she still gets some left knee pain at nighttime. Dealing with plantar right foot pain at 3/10 level with mild tenderness and anteromedial right knee pain at 2/10 level from walking a lot on this one. Wearing immobilizer on left knee. Still with pain anterior left chest - starting physical therapy for this. No skin changes, numbness.  10/25: Patient reports she is doing well. Wearing immobilizer. Pain level 0/10 left knee. Taking voltaren regularly - would like to switch to the gel. Has not been weight bearing on this leg also. Doing physical therapy for left chest, right pes bursitis, plantar fasciitis. No skin changes, numbness.  Past Medical History:  Diagnosis Date  . Abdominal tenderness   . Anorexia nervosa   . Anxiety state, unspecified   . Attention deficit disorder with hyperactivity(314.01)   . Benign neoplasm of choroid    Right  . Borderline personality disorder   . Carbuncle and furuncle of trunk   . Contact dermatitis and other eczema, due to unspecified cause   . Contusion of knee   . Esophageal reflux   . Mastodynia   . Osteoporosis, unspecified   . Painful respiration   . Swelling, mass, or lump in chest   . Thyroid disease   . 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 Transient disorder of initiating or maintaining sleep   . Unspecified hypothyroidism   . Vasovagal syndrome     Current Outpatient Prescriptions on File Prior to Visit   Medication Sig Dispense Refill  . cyclobenzaprine (FLEXERIL) 10 MG tablet Take 1 tablet (10 mg total) by mouth 3 (three) times daily as needed for muscle spasms. 60 tablet 1  . famciclovir (FAMVIR) 500 MG tablet Take 500 mg by mouth 2 (two) times daily.    . famotidine (PEPCID) 20 MG tablet Take 20 mg by mouth.    . fluticasone (FLONASE) 50 MCG/ACT nasal spray 2 sprays by Each Nare route daily as needed.     . ibandronate (BONIVA) 150 MG tablet Take 150 mg by mouth.    . levothyroxine (SYNTHROID, LEVOTHROID) 88 MCG tablet Take 88 mcg by mouth daily.    . midodrine (PROAMATINE) 5 MG tablet TAKE ONE TABLET BY MOUTH THREE TIMES DAILY    . ondansetron (ZOFRAN) 4 MG tablet Take 1 tablet (4 mg total) by mouth every 6 (six) hours as needed for nausea or vomiting. 12 tablet 0  . traMADol (ULTRAM) 50 MG tablet Take 1 tablet (50 mg total) by mouth every 6 (six) hours as needed. 60 tablet 0   No current facility-administered medications on file prior to visit.     Past Surgical History:  Procedure Laterality Date  . CHOLECYSTECTOMY    . THYROID SURGERY    . TONSILLECTOMY    . TONSILLECTOMY      Allergies  Allergen Reactions  . Cephalexin Anaphylaxis  . Doxycycline Rash and Shortness Of Breath  . Erythromycin Base Other (See Comments) and Shortness  Of Breath    GI Upset  . Azithromycin Other (See Comments)  . Bee Venom Other (See Comments)  . Clarithromycin   . Codeine   . Darvon   . Metronidazole Other (See Comments)    Mouth sores  . Nsaids Other (See Comments)    GI Upset  . Penicillins   . Poison Oak Extract Other (See Comments)  . Sulfa Antibiotics   . Vitamin E Other (See Comments)  . Ivp Dye [Iodinated Diagnostic Agents] Nausea And Vomiting, Rash and Other (See Comments)    Dyspnea  . Prednisone Nausea And Vomiting and Rash    Social History   Social History  . Marital status: Single    Spouse name: N/A  . Number of children: N/A  . Years of education: N/A    Occupational History  . Not on file.   Social History Main Topics  . Smoking status: Never Smoker  . Smokeless tobacco: Never Used  . Alcohol use No  . Drug use: No  . Sexual activity: Not on file   Other Topics Concern  . Not on file   Social History Narrative  . No narrative on file    No family history on file.  BP 96/64   Pulse 65   Ht 5\' 1"  (1.549 m)   Wt 135 lb (61.2 kg)   BMI 25.51 kg/m   Review of Systems: See HPI above.    Objective:  Physical Exam:  Gen: NAD, comfortable in exam room  Left knee: No gross deformity, ecchymoses, swelling. No TTP anterior patella.  No joint line tenderness. Full extension.  Flexion to 90 degrees. Negative valgus/varus testing. Negative ant/post drawers. NV intact distally.    Assessment & Plan:  1. Left knee injury - Clinically healed at this point - radiographs last visit with interval healing.  She is over 5 weeks out as well.  Switch to knee brace for support - advance weight bearing now and transition off crutches.  Add physical therapy for this.  Switch to voltaren gel.  F/u in 3-4 weeks.

## 2016-05-05 DIAGNOSIS — S43402D Unspecified sprain of left shoulder joint, subsequent encounter: Secondary | ICD-10-CM | POA: Diagnosis not present

## 2016-05-05 DIAGNOSIS — S20212D Contusion of left front wall of thorax, subsequent encounter: Secondary | ICD-10-CM | POA: Diagnosis not present

## 2016-05-05 DIAGNOSIS — S82032D Displaced transverse fracture of left patella, subsequent encounter for closed fracture with routine healing: Secondary | ICD-10-CM | POA: Diagnosis not present

## 2016-05-05 DIAGNOSIS — F603 Borderline personality disorder: Secondary | ICD-10-CM | POA: Diagnosis not present

## 2016-05-05 DIAGNOSIS — M722 Plantar fascial fibromatosis: Secondary | ICD-10-CM | POA: Diagnosis not present

## 2016-05-05 DIAGNOSIS — M810 Age-related osteoporosis without current pathological fracture: Secondary | ICD-10-CM | POA: Diagnosis not present

## 2016-05-07 DIAGNOSIS — M722 Plantar fascial fibromatosis: Secondary | ICD-10-CM | POA: Diagnosis not present

## 2016-05-07 DIAGNOSIS — K921 Melena: Secondary | ICD-10-CM | POA: Diagnosis not present

## 2016-05-07 DIAGNOSIS — K529 Noninfective gastroenteritis and colitis, unspecified: Secondary | ICD-10-CM | POA: Diagnosis not present

## 2016-05-07 DIAGNOSIS — F603 Borderline personality disorder: Secondary | ICD-10-CM | POA: Diagnosis not present

## 2016-05-07 DIAGNOSIS — E039 Hypothyroidism, unspecified: Secondary | ICD-10-CM | POA: Diagnosis not present

## 2016-05-07 DIAGNOSIS — S20212D Contusion of left front wall of thorax, subsequent encounter: Secondary | ICD-10-CM | POA: Diagnosis not present

## 2016-05-07 DIAGNOSIS — K219 Gastro-esophageal reflux disease without esophagitis: Secondary | ICD-10-CM | POA: Diagnosis not present

## 2016-05-07 DIAGNOSIS — M810 Age-related osteoporosis without current pathological fracture: Secondary | ICD-10-CM | POA: Diagnosis not present

## 2016-05-07 DIAGNOSIS — S43402D Unspecified sprain of left shoulder joint, subsequent encounter: Secondary | ICD-10-CM | POA: Diagnosis not present

## 2016-05-07 DIAGNOSIS — S82032D Displaced transverse fracture of left patella, subsequent encounter for closed fracture with routine healing: Secondary | ICD-10-CM | POA: Diagnosis not present

## 2016-05-07 DIAGNOSIS — G43009 Migraine without aura, not intractable, without status migrainosus: Secondary | ICD-10-CM | POA: Diagnosis not present

## 2016-05-07 DIAGNOSIS — M818 Other osteoporosis without current pathological fracture: Secondary | ICD-10-CM | POA: Diagnosis not present

## 2016-05-08 DIAGNOSIS — N951 Menopausal and female climacteric states: Secondary | ICD-10-CM | POA: Diagnosis not present

## 2016-05-08 DIAGNOSIS — K529 Noninfective gastroenteritis and colitis, unspecified: Secondary | ICD-10-CM | POA: Diagnosis not present

## 2016-05-12 DIAGNOSIS — S43402D Unspecified sprain of left shoulder joint, subsequent encounter: Secondary | ICD-10-CM | POA: Diagnosis not present

## 2016-05-12 DIAGNOSIS — M722 Plantar fascial fibromatosis: Secondary | ICD-10-CM | POA: Diagnosis not present

## 2016-05-12 DIAGNOSIS — S20212D Contusion of left front wall of thorax, subsequent encounter: Secondary | ICD-10-CM | POA: Diagnosis not present

## 2016-05-12 DIAGNOSIS — S82032D Displaced transverse fracture of left patella, subsequent encounter for closed fracture with routine healing: Secondary | ICD-10-CM | POA: Diagnosis not present

## 2016-05-12 DIAGNOSIS — F603 Borderline personality disorder: Secondary | ICD-10-CM | POA: Diagnosis not present

## 2016-05-12 DIAGNOSIS — M810 Age-related osteoporosis without current pathological fracture: Secondary | ICD-10-CM | POA: Diagnosis not present

## 2016-05-13 ENCOUNTER — Encounter: Payer: Self-pay | Admitting: Family Medicine

## 2016-05-13 ENCOUNTER — Ambulatory Visit (INDEPENDENT_AMBULATORY_CARE_PROVIDER_SITE_OTHER): Payer: Medicare Other | Admitting: Family Medicine

## 2016-05-13 ENCOUNTER — Ambulatory Visit: Payer: Medicare Other | Admitting: Family Medicine

## 2016-05-13 DIAGNOSIS — S82092D Other fracture of left patella, subsequent encounter for closed fracture with routine healing: Secondary | ICD-10-CM | POA: Diagnosis not present

## 2016-05-13 DIAGNOSIS — M25561 Pain in right knee: Secondary | ICD-10-CM | POA: Diagnosis not present

## 2016-05-13 DIAGNOSIS — M722 Plantar fascial fibromatosis: Secondary | ICD-10-CM | POA: Diagnosis not present

## 2016-05-13 NOTE — Patient Instructions (Signed)
Continue with the inserts, arch binders. Try adding heel cups to these for more comfort. Try night splints for the plantar fasciitis. We will add physical therapy for your plantar fasciitis of your right foot also. We will look into aquatic therapy for your left knee as well. Follow up with me in 4 weeks for reevaluation. Continue wearing the knee brace. Transition to a cane when tolerated and hopefully you're done using this by the time I see you back as you get stronger.

## 2016-05-14 DIAGNOSIS — F603 Borderline personality disorder: Secondary | ICD-10-CM | POA: Diagnosis not present

## 2016-05-14 DIAGNOSIS — S82032D Displaced transverse fracture of left patella, subsequent encounter for closed fracture with routine healing: Secondary | ICD-10-CM | POA: Diagnosis not present

## 2016-05-14 DIAGNOSIS — S20212D Contusion of left front wall of thorax, subsequent encounter: Secondary | ICD-10-CM | POA: Diagnosis not present

## 2016-05-14 DIAGNOSIS — M810 Age-related osteoporosis without current pathological fracture: Secondary | ICD-10-CM | POA: Diagnosis not present

## 2016-05-14 DIAGNOSIS — S43402D Unspecified sprain of left shoulder joint, subsequent encounter: Secondary | ICD-10-CM | POA: Diagnosis not present

## 2016-05-14 DIAGNOSIS — M722 Plantar fascial fibromatosis: Secondary | ICD-10-CM | POA: Diagnosis not present

## 2016-05-15 NOTE — Assessment & Plan Note (Signed)
Clinically healed at this point.  Continue knee brace - transition off crutches.  Continue PT, home exercises.  We will look into possibility of aquatic therapy for her.  Voltaren gel as needed.  F/u in 4 weeks.

## 2016-05-15 NOTE — Progress Notes (Signed)
PCP: PAYNE, Beverly Gust, NP  Subjective:   HPI: Patient is a 55 y.o. female here for left knee injury.  9/26: Patient reports on 9/19 she tripped and fell forward directly onto her left knee and left side of chest. Immediate pain, difficulty bending her knee, walking. Pain is 2/10 in knee - wearing immobilizer, sharp anterior pain. Pain in left side of rib cage is 3/10 level, also sharp. Worse with a deep breath, cough, sneeze, hiccups. No skin changes, numbness.  10/12: Patient reports she still gets some left knee pain at nighttime. Dealing with plantar right foot pain at 3/10 level with mild tenderness and anteromedial right knee pain at 2/10 level from walking a lot on this one. Wearing immobilizer on left knee. Still with pain anterior left chest - starting physical therapy for this. No skin changes, numbness.  10/25: Patient reports she is doing well. Wearing immobilizer. Pain level 0/10 left knee. Taking voltaren regularly - would like to switch to the gel. Has not been weight bearing on this leg also. Doing physical therapy for left chest, right pes bursitis, plantar fasciitis. No skin changes, numbness.  11/15: Patient reports overall she is much better. Doing home PT, home exercises. Still using crutches. Left knee doesn't feel stable yet but is stronger than last visit. Using knee brace, voltaren gel - was noted to have knee brace on backwards today. Using sleeve on right knee. Worse with overuse. Pain in right foot is 1/10 and dull, worse with walking. Pain 0/10 both knees. No skin changes, numbness.   Past Medical History:  Diagnosis Date  . Abdominal tenderness   . Anorexia nervosa   . Anxiety state, unspecified   . Attention deficit disorder with hyperactivity(314.01)   . Benign neoplasm of choroid    Right  . Borderline personality disorder   . Carbuncle and furuncle of trunk   . Contact dermatitis and other eczema, due to unspecified cause   .  Contusion of knee   . Esophageal reflux   . Mastodynia   . Osteoporosis, unspecified   . Painful respiration   . Swelling, mass, or lump in chest   . Thyroid disease   . 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 Transient disorder of initiating or maintaining sleep   . Unspecified hypothyroidism   . Vasovagal syndrome     Current Outpatient Prescriptions on File Prior to Visit  Medication Sig Dispense Refill  . cyclobenzaprine (FLEXERIL) 10 MG tablet Take 1 tablet (10 mg total) by mouth 3 (three) times daily as needed for muscle spasms. 60 tablet 1  . famciclovir (FAMVIR) 500 MG tablet Take 500 mg by mouth 2 (two) times daily.    . famotidine (PEPCID) 20 MG tablet Take 20 mg by mouth.    . fluticasone (FLONASE) 50 MCG/ACT nasal spray 2 sprays by Each Nare route daily as needed.     . ibandronate (BONIVA) 150 MG tablet Take 150 mg by mouth.    . levothyroxine (SYNTHROID, LEVOTHROID) 88 MCG tablet Take 88 mcg by mouth daily.    . midodrine (PROAMATINE) 5 MG tablet TAKE ONE TABLET BY MOUTH THREE TIMES DAILY    . traMADol (ULTRAM) 50 MG tablet Take 1 tablet (50 mg total) by mouth every 6 (six) hours as needed. 60 tablet 0   No current facility-administered medications on file prior to visit.     Past Surgical History:  Procedure Laterality Date  . CHOLECYSTECTOMY    .  THYROID SURGERY    . TONSILLECTOMY    . TONSILLECTOMY      Allergies  Allergen Reactions  . Cephalexin Anaphylaxis  . Doxycycline Rash and Shortness Of Breath  . Erythromycin Base Other (See Comments) and Shortness Of Breath    GI Upset  . Azithromycin Other (See Comments)  . Bee Venom Other (See Comments)  . Clarithromycin   . Codeine   . Darvon   . Metronidazole Other (See Comments)    Mouth sores  . Nsaids Other (See Comments)    GI Upset  . Penicillins   . Poison Oak Extract Other (See Comments)  . Sulfa Antibiotics   . Vitamin E Other (See Comments)  . Ivp Dye  [Iodinated Diagnostic Agents] Nausea And Vomiting, Rash and Other (See Comments)    Dyspnea  . Prednisone Nausea And Vomiting and Rash    Social History   Social History  . Marital status: Single    Spouse name: N/A  . Number of children: N/A  . Years of education: N/A   Occupational History  . Not on file.   Social History Main Topics  . Smoking status: Never Smoker  . Smokeless tobacco: Never Used  . Alcohol use No  . Drug use: No  . Sexual activity: Not on file   Other Topics Concern  . Not on file   Social History Narrative  . No narrative on file    No family history on file.  BP 115/78   Pulse 87   Ht 5\' 1"  (1.549 m)   Wt 139 lb (63 kg)   BMI 26.26 kg/m   Review of Systems: See HPI above.    Objective:  Physical Exam:  Gen: NAD, comfortable in exam room  Left knee: No gross deformity, ecchymoses, effusion. No TTP anterior patella.  No joint line tenderness. Full extension.  Flexion to 90 degrees. Negative valgus/varus testing. Negative ant/post drawers. NV intact distally.  Right knee: No gross deformity, ecchymoses, swelling. No TTP. FROM. Negative ant/post drawers. Negative valgus/varus testing. Negative lachmanns. Negative mcmurrays, apleys, patellar apprehension. NV intact distally.  Right foot/ankle: No gross deformity, swelling, ecchymoses FROM TTP mildly in body of plantar fascia.  No other tenderness. Negative ant drawer and talar tilt.   Negative calcaneal squeeze. Thompsons test negative. NV intact distally.    Assessment & Plan:  1. Left knee injury - Clinically healed at this point.  Continue knee brace - transition off crutches.  Continue PT, home exercises.  We will look into possibility of aquatic therapy for her.  Voltaren gel as needed.  F/u in 4 weeks.  2. Right plantar fasciitis - continue arch supports, arch binders.  Consider heel cups, night splints.  Will add PT for this also.  F/u in 4 weeks for  reevaluation.  3. Right knee pain - improved.  Continue home exercises.

## 2016-05-15 NOTE — Assessment & Plan Note (Signed)
improved.  Continue home exercises.

## 2016-05-15 NOTE — Assessment & Plan Note (Signed)
continue arch supports, arch binders.  Consider heel cups, night splints.  Will add PT for this also.  F/u in 4 weeks for reevaluation.

## 2016-05-18 ENCOUNTER — Telehealth: Payer: Self-pay | Admitting: Family Medicine

## 2016-05-18 NOTE — Telephone Encounter (Signed)
I wouldn't be concerned.  This has healed at this point.  Her quad is really weak so she's going to have symptoms related to kneecap tracking until she builds this back up.

## 2016-05-18 NOTE — Telephone Encounter (Signed)
Spoke to patient and gave her information provided by physician. 

## 2016-05-20 ENCOUNTER — Telehealth: Payer: Self-pay | Admitting: Family Medicine

## 2016-05-20 DIAGNOSIS — S82032D Displaced transverse fracture of left patella, subsequent encounter for closed fracture with routine healing: Secondary | ICD-10-CM | POA: Diagnosis not present

## 2016-05-20 DIAGNOSIS — F603 Borderline personality disorder: Secondary | ICD-10-CM | POA: Diagnosis not present

## 2016-05-20 DIAGNOSIS — S20212D Contusion of left front wall of thorax, subsequent encounter: Secondary | ICD-10-CM | POA: Diagnosis not present

## 2016-05-20 DIAGNOSIS — M810 Age-related osteoporosis without current pathological fracture: Secondary | ICD-10-CM | POA: Diagnosis not present

## 2016-05-20 DIAGNOSIS — M722 Plantar fascial fibromatosis: Secondary | ICD-10-CM | POA: Diagnosis not present

## 2016-05-20 DIAGNOSIS — S43402D Unspecified sprain of left shoulder joint, subsequent encounter: Secondary | ICD-10-CM | POA: Diagnosis not present

## 2016-05-20 NOTE — Telephone Encounter (Signed)
That's ok with me  Thanks

## 2016-05-20 NOTE — Telephone Encounter (Signed)
Call placed and gave verbal ok per physician.

## 2016-05-25 ENCOUNTER — Telehealth: Payer: Self-pay | Admitting: Family Medicine

## 2016-05-25 NOTE — Telephone Encounter (Signed)
Order has been sent for this.

## 2016-05-25 NOTE — Telephone Encounter (Signed)
Ok to write for this.  Thanks!

## 2016-05-26 DIAGNOSIS — M722 Plantar fascial fibromatosis: Secondary | ICD-10-CM | POA: Diagnosis not present

## 2016-05-26 DIAGNOSIS — S82032D Displaced transverse fracture of left patella, subsequent encounter for closed fracture with routine healing: Secondary | ICD-10-CM | POA: Diagnosis not present

## 2016-05-26 DIAGNOSIS — M810 Age-related osteoporosis without current pathological fracture: Secondary | ICD-10-CM | POA: Diagnosis not present

## 2016-05-26 DIAGNOSIS — S20212D Contusion of left front wall of thorax, subsequent encounter: Secondary | ICD-10-CM | POA: Diagnosis not present

## 2016-05-26 DIAGNOSIS — S43402D Unspecified sprain of left shoulder joint, subsequent encounter: Secondary | ICD-10-CM | POA: Diagnosis not present

## 2016-05-26 DIAGNOSIS — F603 Borderline personality disorder: Secondary | ICD-10-CM | POA: Diagnosis not present

## 2016-06-01 ENCOUNTER — Ambulatory Visit (INDEPENDENT_AMBULATORY_CARE_PROVIDER_SITE_OTHER): Payer: Medicare Other | Admitting: Family Medicine

## 2016-06-01 ENCOUNTER — Encounter: Payer: Self-pay | Admitting: Family Medicine

## 2016-06-01 DIAGNOSIS — S82092D Other fracture of left patella, subsequent encounter for closed fracture with routine healing: Secondary | ICD-10-CM | POA: Diagnosis not present

## 2016-06-01 DIAGNOSIS — S43402D Unspecified sprain of left shoulder joint, subsequent encounter: Secondary | ICD-10-CM | POA: Diagnosis not present

## 2016-06-01 DIAGNOSIS — M722 Plantar fascial fibromatosis: Secondary | ICD-10-CM | POA: Diagnosis not present

## 2016-06-01 DIAGNOSIS — F603 Borderline personality disorder: Secondary | ICD-10-CM | POA: Diagnosis not present

## 2016-06-01 DIAGNOSIS — S20212D Contusion of left front wall of thorax, subsequent encounter: Secondary | ICD-10-CM | POA: Diagnosis not present

## 2016-06-01 DIAGNOSIS — M810 Age-related osteoporosis without current pathological fracture: Secondary | ICD-10-CM | POA: Diagnosis not present

## 2016-06-01 DIAGNOSIS — S82032D Displaced transverse fracture of left patella, subsequent encounter for closed fracture with routine healing: Secondary | ICD-10-CM | POA: Diagnosis not present

## 2016-06-01 MED ORDER — DICLOFENAC SODIUM 1 % TD GEL: 4.0000 g | Freq: Four times a day (QID) | TRANSDERMAL | 2 refills | Status: DC

## 2016-06-01 MED FILL — VOLTAREN 1% GEL: 1 | 31 days supply | Qty: 500 | Fill #0

## 2016-06-01 NOTE — Patient Instructions (Signed)
We will go ahead with an MRI of your knee to assess for a concurrent meniscus tear. In meantime continue with brace, PT, cane or crutches.

## 2016-06-02 ENCOUNTER — Telehealth: Payer: Self-pay | Admitting: Family Medicine

## 2016-06-02 NOTE — Telephone Encounter (Signed)
Yes this is ok to extend her PT.

## 2016-06-02 NOTE — Telephone Encounter (Signed)
Appointment set for MRI.

## 2016-06-03 DIAGNOSIS — S43402D Unspecified sprain of left shoulder joint, subsequent encounter: Secondary | ICD-10-CM | POA: Diagnosis not present

## 2016-06-03 DIAGNOSIS — M810 Age-related osteoporosis without current pathological fracture: Secondary | ICD-10-CM | POA: Diagnosis not present

## 2016-06-03 DIAGNOSIS — S20212D Contusion of left front wall of thorax, subsequent encounter: Secondary | ICD-10-CM | POA: Diagnosis not present

## 2016-06-03 DIAGNOSIS — S82032D Displaced transverse fracture of left patella, subsequent encounter for closed fracture with routine healing: Secondary | ICD-10-CM | POA: Diagnosis not present

## 2016-06-03 DIAGNOSIS — F603 Borderline personality disorder: Secondary | ICD-10-CM | POA: Diagnosis not present

## 2016-06-03 DIAGNOSIS — M722 Plantar fascial fibromatosis: Secondary | ICD-10-CM | POA: Diagnosis not present

## 2016-06-03 NOTE — Assessment & Plan Note (Signed)
Patellar fracture clinically healed at this point.  Continue knee brace.  Possible she has a concurrent meniscus tear impairing her ability to get back to 100%.  Will go ahead with MRI.  Voltaren gel, continue PT, home exercises in meantime.

## 2016-06-03 NOTE — Progress Notes (Addendum)
PCP: PAYNE, Beverly Gust, NP  Subjective:   HPI: Patient is a 55 y.o. female here for left knee injury.  9/26: Patient reports on 9/19 she tripped and fell forward directly onto her left knee and left side of chest. Immediate pain, difficulty bending her knee, walking. Pain is 2/10 in knee - wearing immobilizer, sharp anterior pain. Pain in left side of rib cage is 3/10 level, also sharp. Worse with a deep breath, cough, sneeze, hiccups. No skin changes, numbness.  10/12: Patient reports she still gets some left knee pain at nighttime. Dealing with plantar right foot pain at 3/10 level with mild tenderness and anteromedial right knee pain at 2/10 level from walking a lot on this one. Wearing immobilizer on left knee. Still with pain anterior left chest - starting physical therapy for this. No skin changes, numbness.  10/25: Patient reports she is doing well. Wearing immobilizer. Pain level 0/10 left knee. Taking voltaren regularly - would like to switch to the gel. Has not been weight bearing on this leg also. Doing physical therapy for left chest, right pes bursitis, plantar fasciitis. No skin changes, numbness.  11/15: Patient reports overall she is much better. Doing home PT, home exercises. Still using crutches. Left knee doesn't feel stable yet but is stronger than last visit. Using knee brace, voltaren gel - was noted to have knee brace on backwards today. Using sleeve on right knee. Worse with overuse. Pain in right foot is 1/10 and dull, worse with walking. Pain 0/10 both knees. No skin changes, numbness.  12/5: Patient reports she's having trouble bearing weight on her injured left knee. Doing physical therapy. Recalls walking too much on Saturday at [facility] - had difficulty getting back to exit after this. Has improved some since then - pain down to 1/10. Using crutches. Has been icing. Due to start aquatic therapy tomorrow. No skin changes, numbness.  Past  Medical History:  Diagnosis Date  . Abdominal tenderness   . Anorexia nervosa   . Anxiety state, unspecified   . Attention deficit disorder with hyperactivity(314.01)   . Benign neoplasm of choroid    Right  . Borderline personality disorder   . Carbuncle and furuncle of trunk   . Contact dermatitis and other eczema, due to unspecified cause   . Contusion of knee   . Esophageal reflux   . Mastodynia   . Osteoporosis, unspecified   . Painful respiration   . Swelling, mass, or lump in chest   . Thyroid disease   . 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 Transient disorder of initiating or maintaining sleep   . Unspecified hypothyroidism   . Vasovagal syndrome     Current Outpatient Prescriptions on File Prior to Visit  Medication Sig Dispense Refill  . cyclobenzaprine (FLEXERIL) 10 MG tablet Take 1 tablet (10 mg total) by mouth 3 (three) times daily as needed for muscle spasms. 60 tablet 1  . famciclovir (FAMVIR) 500 MG tablet Take 500 mg by mouth 2 (two) times daily.    . famotidine (PEPCID) 20 MG tablet Take 20 mg by mouth.    . fluticasone (FLONASE) 50 MCG/ACT nasal spray 2 sprays by Each Nare route daily as needed.     . ibandronate (BONIVA) 150 MG tablet Take 150 mg by mouth.    . levothyroxine (SYNTHROID, LEVOTHROID) 88 MCG tablet Take 88 mcg by mouth daily.    . midodrine (PROAMATINE) 5 MG tablet TAKE ONE  TABLET BY MOUTH THREE TIMES DAILY    . traMADol (ULTRAM) 50 MG tablet Take 1 tablet (50 mg total) by mouth every 6 (six) hours as needed. 60 tablet 0   No current facility-administered medications on file prior to visit.     Past Surgical History:  Procedure Laterality Date  . CHOLECYSTECTOMY    . THYROID SURGERY    . TONSILLECTOMY    . TONSILLECTOMY      Allergies  Allergen Reactions  . Cephalexin Anaphylaxis  . Doxycycline Rash and Shortness Of Breath  . Erythromycin Base Other (See Comments) and Shortness Of Breath    GI Upset   . Azithromycin Other (See Comments)  . Bee Venom Other (See Comments)  . Clarithromycin   . Codeine   . Darvon   . Metronidazole Other (See Comments)    Mouth sores  . Nsaids Other (See Comments)    GI Upset  . Penicillins   . Poison Oak Extract Other (See Comments)  . Sulfa Antibiotics   . Vitamin E Other (See Comments)  . Ivp Dye [Iodinated Diagnostic Agents] Nausea And Vomiting, Rash and Other (See Comments)    Dyspnea  . Prednisone Nausea And Vomiting and Rash    Social History   Social History  . Marital status: Single    Spouse name: N/A  . Number of children: N/A  . Years of education: N/A   Occupational History  . Not on file.   Social History Main Topics  . Smoking status: Never Smoker  . Smokeless tobacco: Never Used  . Alcohol use No  . Drug use: No  . Sexual activity: Not on file   Other Topics Concern  . Not on file   Social History Narrative  . No narrative on file    No family history on file.  BP 124/79   Pulse 67   Ht 5\' 1"  (1.549 m)   Wt 129 lb (58.5 kg)   BMI 24.37 kg/m   Review of Systems: See HPI above.    Objective:  Physical Exam:  Gen: NAD, comfortable in exam room  Left knee: No gross deformity, ecchymoses, effusion. Mild tenderness medial joint line.  No TTP anterior patella.  No other tenderness. ROM 0 - 100 degrees. Negative valgus/varus testing. Negative ant/post drawers. NV intact distally.  Assessment & Plan:  1. Left knee injury - Patellar fracture clinically healed at this point.  Continue knee brace.  Possible she has a concurrent meniscus tear impairing her ability to get back to 100%.  Will go ahead with MRI.  Voltaren gel, continue PT, home exercises in meantime.  Addendum:  MRI reviewed and discussed with patient.  No evidence meniscus tear or other acute abnormalities.  Patellar fracture healing as expected.  Some evidence fat pad edema.  Encouraged continued physical therapy - believe pain is primarily due  to weakness at this point given her exam, MRI.

## 2016-06-04 ENCOUNTER — Telehealth: Payer: Self-pay | Admitting: Family Medicine

## 2016-06-04 NOTE — Telephone Encounter (Signed)
Spoke to patient and she was talking about the pills, however informed patient that you can't take the pills and use the gel together.

## 2016-06-04 NOTE — Telephone Encounter (Signed)
Is she talking about the pills?  She can't take those and the voltaren gel together.  I sent voltaren gel in downstairs on the 4th.

## 2016-06-06 DIAGNOSIS — F419 Anxiety disorder, unspecified: Secondary | ICD-10-CM | POA: Diagnosis not present

## 2016-06-06 DIAGNOSIS — F4310 Post-traumatic stress disorder, unspecified: Secondary | ICD-10-CM | POA: Diagnosis not present

## 2016-06-06 DIAGNOSIS — M67864 Other specified disorders of tendon, left knee: Secondary | ICD-10-CM | POA: Diagnosis not present

## 2016-06-06 DIAGNOSIS — M810 Age-related osteoporosis without current pathological fracture: Secondary | ICD-10-CM | POA: Diagnosis not present

## 2016-06-06 DIAGNOSIS — Z9181 History of falling: Secondary | ICD-10-CM | POA: Diagnosis not present

## 2016-06-06 DIAGNOSIS — F603 Borderline personality disorder: Secondary | ICD-10-CM | POA: Diagnosis not present

## 2016-06-06 DIAGNOSIS — S82092D Other fracture of left patella, subsequent encounter for closed fracture with routine healing: Secondary | ICD-10-CM | POA: Diagnosis not present

## 2016-06-06 DIAGNOSIS — S82032D Displaced transverse fracture of left patella, subsequent encounter for closed fracture with routine healing: Secondary | ICD-10-CM | POA: Diagnosis not present

## 2016-06-06 DIAGNOSIS — M722 Plantar fascial fibromatosis: Secondary | ICD-10-CM | POA: Diagnosis not present

## 2016-06-08 ENCOUNTER — Telehealth: Payer: Self-pay | Admitting: Family Medicine

## 2016-06-08 DIAGNOSIS — M810 Age-related osteoporosis without current pathological fracture: Secondary | ICD-10-CM | POA: Diagnosis not present

## 2016-06-08 DIAGNOSIS — S82032D Displaced transverse fracture of left patella, subsequent encounter for closed fracture with routine healing: Secondary | ICD-10-CM | POA: Diagnosis not present

## 2016-06-08 DIAGNOSIS — F4310 Post-traumatic stress disorder, unspecified: Secondary | ICD-10-CM | POA: Diagnosis not present

## 2016-06-08 DIAGNOSIS — M722 Plantar fascial fibromatosis: Secondary | ICD-10-CM | POA: Diagnosis not present

## 2016-06-08 DIAGNOSIS — F419 Anxiety disorder, unspecified: Secondary | ICD-10-CM | POA: Diagnosis not present

## 2016-06-08 DIAGNOSIS — F603 Borderline personality disorder: Secondary | ICD-10-CM | POA: Diagnosis not present

## 2016-06-08 NOTE — Telephone Encounter (Signed)
I just received MRI report and will contact her with results later today.

## 2016-06-10 ENCOUNTER — Ambulatory Visit: Payer: Medicare Other | Admitting: Family Medicine

## 2016-06-11 ENCOUNTER — Ambulatory Visit: Payer: Medicare Other | Admitting: Family Medicine

## 2016-06-12 ENCOUNTER — Encounter: Payer: Self-pay | Admitting: Family Medicine

## 2016-06-13 DIAGNOSIS — F4310 Post-traumatic stress disorder, unspecified: Secondary | ICD-10-CM | POA: Diagnosis not present

## 2016-06-13 DIAGNOSIS — M722 Plantar fascial fibromatosis: Secondary | ICD-10-CM | POA: Diagnosis not present

## 2016-06-13 DIAGNOSIS — F603 Borderline personality disorder: Secondary | ICD-10-CM | POA: Diagnosis not present

## 2016-06-13 DIAGNOSIS — M810 Age-related osteoporosis without current pathological fracture: Secondary | ICD-10-CM | POA: Diagnosis not present

## 2016-06-13 DIAGNOSIS — S82032D Displaced transverse fracture of left patella, subsequent encounter for closed fracture with routine healing: Secondary | ICD-10-CM | POA: Diagnosis not present

## 2016-06-13 DIAGNOSIS — F419 Anxiety disorder, unspecified: Secondary | ICD-10-CM | POA: Diagnosis not present

## 2016-06-15 DIAGNOSIS — F4310 Post-traumatic stress disorder, unspecified: Secondary | ICD-10-CM | POA: Diagnosis not present

## 2016-06-15 DIAGNOSIS — F603 Borderline personality disorder: Secondary | ICD-10-CM | POA: Diagnosis not present

## 2016-06-15 DIAGNOSIS — S82032D Displaced transverse fracture of left patella, subsequent encounter for closed fracture with routine healing: Secondary | ICD-10-CM | POA: Diagnosis not present

## 2016-06-15 DIAGNOSIS — F419 Anxiety disorder, unspecified: Secondary | ICD-10-CM | POA: Diagnosis not present

## 2016-06-15 DIAGNOSIS — M810 Age-related osteoporosis without current pathological fracture: Secondary | ICD-10-CM | POA: Diagnosis not present

## 2016-06-15 DIAGNOSIS — M722 Plantar fascial fibromatosis: Secondary | ICD-10-CM | POA: Diagnosis not present

## 2016-06-16 ENCOUNTER — Telehealth: Payer: Self-pay | Admitting: Family Medicine

## 2016-06-16 MED ORDER — DICLOFENAC SODIUM 75 MG PO TBEC: 75.0000 mg | DELAYED_RELEASE_TABLET | Freq: Two times a day (BID) | ORAL | 1 refills | Status: DC

## 2016-06-16 NOTE — Telephone Encounter (Signed)
Spoke to patient and she would like to do the voltaren pills. Send to [facility] on [address].

## 2016-06-16 NOTE — Telephone Encounter (Signed)
I think the reason we did the gel is because nsaids upset her stomach.  Is she sure she wants to try the pills?

## 2016-06-19 DIAGNOSIS — S82032D Displaced transverse fracture of left patella, subsequent encounter for closed fracture with routine healing: Secondary | ICD-10-CM | POA: Diagnosis not present

## 2016-06-19 DIAGNOSIS — F603 Borderline personality disorder: Secondary | ICD-10-CM | POA: Diagnosis not present

## 2016-06-19 DIAGNOSIS — M810 Age-related osteoporosis without current pathological fracture: Secondary | ICD-10-CM | POA: Diagnosis not present

## 2016-06-19 DIAGNOSIS — M722 Plantar fascial fibromatosis: Secondary | ICD-10-CM | POA: Diagnosis not present

## 2016-06-19 DIAGNOSIS — F4310 Post-traumatic stress disorder, unspecified: Secondary | ICD-10-CM | POA: Diagnosis not present

## 2016-06-19 DIAGNOSIS — F419 Anxiety disorder, unspecified: Secondary | ICD-10-CM | POA: Diagnosis not present

## 2016-06-24 DIAGNOSIS — F419 Anxiety disorder, unspecified: Secondary | ICD-10-CM | POA: Diagnosis not present

## 2016-06-24 DIAGNOSIS — M810 Age-related osteoporosis without current pathological fracture: Secondary | ICD-10-CM | POA: Diagnosis not present

## 2016-06-24 DIAGNOSIS — F4310 Post-traumatic stress disorder, unspecified: Secondary | ICD-10-CM | POA: Diagnosis not present

## 2016-06-24 DIAGNOSIS — F603 Borderline personality disorder: Secondary | ICD-10-CM | POA: Diagnosis not present

## 2016-06-24 DIAGNOSIS — S82032D Displaced transverse fracture of left patella, subsequent encounter for closed fracture with routine healing: Secondary | ICD-10-CM | POA: Diagnosis not present

## 2016-06-24 DIAGNOSIS — M722 Plantar fascial fibromatosis: Secondary | ICD-10-CM | POA: Diagnosis not present

## 2016-06-26 DIAGNOSIS — S82032D Displaced transverse fracture of left patella, subsequent encounter for closed fracture with routine healing: Secondary | ICD-10-CM | POA: Diagnosis not present

## 2016-06-26 DIAGNOSIS — M722 Plantar fascial fibromatosis: Secondary | ICD-10-CM | POA: Diagnosis not present

## 2016-06-26 DIAGNOSIS — F4310 Post-traumatic stress disorder, unspecified: Secondary | ICD-10-CM | POA: Diagnosis not present

## 2016-06-26 DIAGNOSIS — M810 Age-related osteoporosis without current pathological fracture: Secondary | ICD-10-CM | POA: Diagnosis not present

## 2016-06-26 DIAGNOSIS — F419 Anxiety disorder, unspecified: Secondary | ICD-10-CM | POA: Diagnosis not present

## 2016-06-26 DIAGNOSIS — F603 Borderline personality disorder: Secondary | ICD-10-CM | POA: Diagnosis not present

## 2016-07-01 DIAGNOSIS — E039 Hypothyroidism, unspecified: Secondary | ICD-10-CM | POA: Diagnosis not present

## 2016-07-01 DIAGNOSIS — M818 Other osteoporosis without current pathological fracture: Secondary | ICD-10-CM | POA: Diagnosis not present

## 2016-07-01 DIAGNOSIS — E559 Vitamin D deficiency, unspecified: Secondary | ICD-10-CM | POA: Diagnosis not present

## 2016-07-02 ENCOUNTER — Ambulatory Visit: Payer: Medicare Other | Admitting: Family Medicine

## 2016-07-03 ENCOUNTER — Telehealth: Payer: Self-pay | Admitting: Family Medicine

## 2016-07-03 NOTE — Telephone Encounter (Signed)
That's fine if patient would like to go there.

## 2016-07-03 NOTE — Telephone Encounter (Signed)
Spoke to patient and rescheduled her appointment. Wants to talk about continuing PT.

## 2016-07-06 ENCOUNTER — Ambulatory Visit: Payer: Medicare Other | Admitting: Family Medicine

## 2016-07-06 ENCOUNTER — Encounter: Payer: Self-pay | Admitting: Family Medicine

## 2016-07-06 ENCOUNTER — Ambulatory Visit (INDEPENDENT_AMBULATORY_CARE_PROVIDER_SITE_OTHER): Payer: Medicare HMO | Admitting: Family Medicine

## 2016-07-06 VITALS — BP 139/74 | HR 89 | Ht 61.0 in | Wt 130.0 lb

## 2016-07-06 DIAGNOSIS — S82092D Other fracture of left patella, subsequent encounter for closed fracture with routine healing: Secondary | ICD-10-CM

## 2016-07-06 DIAGNOSIS — M25561 Pain in right knee: Secondary | ICD-10-CM

## 2016-07-06 DIAGNOSIS — M25562 Pain in left knee: Secondary | ICD-10-CM

## 2016-07-06 NOTE — Patient Instructions (Signed)
Start outpatient physical therapy and aqua therapy. Use sleeve or ACE wrap as needed for swelling. Use knee brace with long walking. Follow up with me in 2 months.

## 2016-07-07 NOTE — Progress Notes (Signed)
PCP: PAYNE, Beverly Gust, NP  Subjective:   HPI: Patient is a 56 y.o. female here for left knee injury.  9/26: Patient reports on 9/19 she tripped and fell forward directly onto her left knee and left side of chest. Immediate pain, difficulty bending her knee, walking. Pain is 2/10 in knee - wearing immobilizer, sharp anterior pain. Pain in left side of rib cage is 3/10 level, also sharp. Worse with a deep breath, cough, sneeze, hiccups. No skin changes, numbness.  10/12: Patient reports she still gets some left knee pain at nighttime. Dealing with plantar right foot pain at 3/10 level with mild tenderness and anteromedial right knee pain at 2/10 level from walking a lot on this one. Wearing immobilizer on left knee. Still with pain anterior left chest - starting physical therapy for this. No skin changes, numbness.  10/25: Patient reports she is doing well. Wearing immobilizer. Pain level 0/10 left knee. Taking voltaren regularly - would like to switch to the gel. Has not been weight bearing on this leg also. Doing physical therapy for left chest, right pes bursitis, plantar fasciitis. No skin changes, numbness.  11/15: Patient reports overall she is much better. Doing home PT, home exercises. Still using crutches. Left knee doesn't feel stable yet but is stronger than last visit. Using knee brace, voltaren gel - was noted to have knee brace on backwards today. Using sleeve on right knee. Worse with overuse. Pain in right foot is 1/10 and dull, worse with walking. Pain 0/10 both knees. No skin changes, numbness.  12/5: Patient reports she's having trouble bearing weight on her injured left knee. Doing physical therapy. Recalls walking too much on Saturday at [facility] - had difficulty getting back to exit after this. Has improved some since then - pain down to 1/10. Using crutches. Has been icing. Due to start aquatic therapy tomorrow. No skin changes,  numbness.  07/06/16: Patient reports she has improved but still feels weak. Does not trust left knee and will give out if walking long distance. Has 2/10 level of pain when walking. Right knee only a little soreness anteriorly. Also with pain 1/10 level left IT band and this feels tight. Back pain to 4/10 level. No skin changes, numbness. Doing home PT and will be transitioning to outpatient PT and aqua therapy.  Past Medical History:  Diagnosis Date  . Abdominal tenderness   . Anorexia nervosa   . Anxiety state, unspecified   . Attention deficit disorder with hyperactivity(314.01)   . Benign neoplasm of choroid    Right  . Borderline personality disorder   . Carbuncle and furuncle of trunk   . Contact dermatitis and other eczema, due to unspecified cause   . Contusion of knee   . Esophageal reflux   . Mastodynia   . Osteoporosis, unspecified   . Painful respiration   . Swelling, mass, or lump in chest   . Thyroid disease   . 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 Transient disorder of initiating or maintaining sleep   . Unspecified hypothyroidism   . Vasovagal syndrome     Current Outpatient Prescriptions on File Prior to Visit  Medication Sig Dispense Refill  . cyclobenzaprine (FLEXERIL) 10 MG tablet Take 1 tablet (10 mg total) by mouth 3 (three) times daily as needed for muscle spasms. 60 tablet 1  . diclofenac (VOLTAREN) 75 MG EC tablet Take 1 tablet (75 mg total) by mouth 2 (two) times  daily. 60 tablet 1  . diclofenac sodium (VOLTAREN) 1 % GEL Apply 4 g topically 4 (four) times daily. 5 Tube 2  . famciclovir (FAMVIR) 500 MG tablet Take 500 mg by mouth 2 (two) times daily.    . famotidine (PEPCID) 20 MG tablet Take 20 mg by mouth.    . fluticasone (FLONASE) 50 MCG/ACT nasal spray 2 sprays by Each Nare route daily as needed.     . ibandronate (BONIVA) 150 MG tablet Take 150 mg by mouth.    . midodrine (PROAMATINE) 5 MG tablet TAKE ONE  TABLET BY MOUTH THREE TIMES DAILY    . traMADol (ULTRAM) 50 MG tablet Take 1 tablet (50 mg total) by mouth every 6 (six) hours as needed. 60 tablet 0   No current facility-administered medications on file prior to visit.     Past Surgical History:  Procedure Laterality Date  . CHOLECYSTECTOMY    . THYROID SURGERY    . TONSILLECTOMY    . TONSILLECTOMY      Allergies  Allergen Reactions  . Cephalexin Anaphylaxis  . Doxycycline Rash and Shortness Of Breath  . Erythromycin Base Other (See Comments) and Shortness Of Breath    GI Upset  . Azithromycin Other (See Comments)  . Bee Venom Other (See Comments)  . Clarithromycin   . Codeine   . Darvon   . Metronidazole Other (See Comments)    Mouth sores  . Nsaids Other (See Comments)    GI Upset  . Penicillins   . Poison Oak Extract Other (See Comments)  . Sulfa Antibiotics   . Vitamin E Other (See Comments)  . Ivp Dye [Iodinated Diagnostic Agents] Nausea And Vomiting, Rash and Other (See Comments)    Dyspnea  . Prednisone Nausea And Vomiting and Rash    Social History   Social History  . Marital status: Single    Spouse name: N/A  . Number of children: N/A  . Years of education: N/A   Occupational History  . Not on file.   Social History Main Topics  . Smoking status: Never Smoker  . Smokeless tobacco: Never Used  . Alcohol use No  . Drug use: No  . Sexual activity: Not on file   Other Topics Concern  . Not on file   Social History Narrative  . No narrative on file    No family history on file.  BP 139/74   Pulse 89   Ht 5\' 1"  (1.549 m)   Wt 130 lb (59 kg)   BMI 24.56 kg/m   Review of Systems: See HPI above.    Objective:  Physical Exam:  Gen: NAD, comfortable in exam room  Left knee: No gross deformity, ecchymoses, effusion. No tenderness throughout anterior knee including patella, joint lines currently. ROM 0 - 120 degrees. Negative valgus/varus testing. Negative ant/post drawers. Negative  mcmurrays and apleys. NV intact distally.  Right knee: No gross deformity, ecchymoses, swelling. No TTP. FROM. Negative ant/post drawers. Negative valgus/varus testing. Negative lachmanns. Negative mcmurrays, apleys, patellar apprehension. NV intact distally.  Assessment & Plan:  1. Left knee injury - Patellar fracture healed.  MRI performed because of continued problems with pain, instability was reassuring.  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  She will transition to outpatient PT and aqua therapy.  Stressed importance of home exercises as well.  Voltaren gel if needed.  Sleeve, ACE wrap.  F/u in 2 months.

## 2016-07-07 NOTE — Assessment & Plan Note (Signed)
Patellar fracture healed.  MRI performed because of continued problems with pain, instability was reassuring.  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  She will transition to outpatient PT and aqua therapy.  Stressed importance of home exercises as well.  Voltaren gel if needed.  Sleeve, ACE wrap.  F/u in 2 months.

## 2016-07-17 ENCOUNTER — Ambulatory Visit (HOSPITAL_BASED_OUTPATIENT_CLINIC_OR_DEPARTMENT_OTHER)
Admission: RE | Admit: 2016-07-17 | Discharge: 2016-07-17 | Disposition: A | Discharge status: Home / Self Care | Payer: Medicare HMO | Source: Ambulatory Visit | Attending: Family Medicine | Admitting: Family Medicine

## 2016-07-17 ENCOUNTER — Telehealth: Payer: Self-pay | Admitting: Family Medicine

## 2016-07-17 ENCOUNTER — Ambulatory Visit (INDEPENDENT_AMBULATORY_CARE_PROVIDER_SITE_OTHER): Payer: Medicare HMO | Admitting: Family Medicine

## 2016-07-17 ENCOUNTER — Encounter: Payer: Self-pay | Admitting: Family Medicine

## 2016-07-17 VITALS — BP 124/82 | Ht 61.0 in | Wt 132.0 lb

## 2016-07-17 DIAGNOSIS — M7989 Other specified soft tissue disorders: Secondary | ICD-10-CM | POA: Insufficient documentation

## 2016-07-17 DIAGNOSIS — M79605 Pain in left leg: Secondary | ICD-10-CM | POA: Diagnosis not present

## 2016-07-17 NOTE — Telephone Encounter (Signed)
Okay, she will to be here by 12 for ultrasound.

## 2016-07-17 NOTE — Telephone Encounter (Signed)
She needs to be seen somewhere for an ultrasound of her calf it sounds like.  If she can't get here before noon she should go to an emergency department.

## 2016-07-17 NOTE — Telephone Encounter (Signed)
She is trying to get here by 12, bc of the weather but if not, can you order it to be done downstairs without doing the whole wait in the ED thing?

## 2016-07-21 DIAGNOSIS — M25562 Pain in left knee: Secondary | ICD-10-CM | POA: Insufficient documentation

## 2016-07-21 NOTE — Assessment & Plan Note (Signed)
Patellar fracture healed.  MRI performed because of continued problems with pain, instability was reassuring.  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  Doppler u/s done today to rule out DVT and was negative.  Reassured patient.  Encouraged to continue physical therapy, home exercises.  Voltaren gel if needed.  Sleeve, ACE wrap.  F/u in 2 months.

## 2016-07-21 NOTE — Progress Notes (Signed)
PCP: PAYNE, Beverly Gust, NP  Subjective:   HPI: Patient is a 56 y.o. female here for left knee injury.  9/26: Patient reports on 9/19 she tripped and fell forward directly onto her left knee and left side of chest. Immediate pain, difficulty bending her knee, walking. Pain is 2/10 in knee - wearing immobilizer, sharp anterior pain. Pain in left side of rib cage is 3/10 level, also sharp. Worse with a deep breath, cough, sneeze, hiccups. No skin changes, numbness.  10/12: Patient reports she still gets some left knee pain at nighttime. Dealing with plantar right foot pain at 3/10 level with mild tenderness and anteromedial right knee pain at 2/10 level from walking a lot on this one. Wearing immobilizer on left knee. Still with pain anterior left chest - starting physical therapy for this. No skin changes, numbness.  10/25: Patient reports she is doing well. Wearing immobilizer. Pain level 0/10 left knee. Taking voltaren regularly - would like to switch to the gel. Has not been weight bearing on this leg also. Doing physical therapy for left chest, right pes bursitis, plantar fasciitis. No skin changes, numbness.  11/15: Patient reports overall she is much better. Doing home PT, home exercises. Still using crutches. Left knee doesn't feel stable yet but is stronger than last visit. Using knee brace, voltaren gel - was noted to have knee brace on backwards today. Using sleeve on right knee. Worse with overuse. Pain in right foot is 1/10 and dull, worse with walking. Pain 0/10 both knees. No skin changes, numbness.  12/5: Patient reports she's having trouble bearing weight on her injured left knee. Doing physical therapy. Recalls walking too much on Saturday at [facility] - had difficulty getting back to exit after this. Has improved some since then - pain down to 1/10. Using crutches. Has been icing. Due to start aquatic therapy tomorrow. No skin changes,  numbness.  07/06/16: Patient reports she has improved but still feels weak. Does not trust left knee and will give out if walking long distance. Has 2/10 level of pain when walking. Right knee only a little soreness anteriorly. Also with pain 1/10 level left IT band and this feels tight. Back pain to 4/10 level. No skin changes, numbness. Doing home PT and will be transitioning to outpatient PT and aqua therapy.  1/19: Patient came in today because she has had increased calf pain, noticed a knot posterior calf as well. Pain is a soreness, worse to the touch. No numbness or tingling. Worse with walking. No skin changes, redness.  Past Medical History:  Diagnosis Date  . Abdominal tenderness   . Anorexia nervosa   . Anxiety state, unspecified   . Attention deficit disorder with hyperactivity(314.01)   . Benign neoplasm of choroid    Right  . Borderline personality disorder   . Carbuncle and furuncle of trunk   . Contact dermatitis and other eczema, due to unspecified cause   . Contusion of knee   . Esophageal reflux   . Mastodynia   . Osteoporosis, unspecified   . Painful respiration   . Swelling, mass, or lump in chest   . Thyroid disease   . 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 Transient disorder of initiating or maintaining sleep   . Unspecified hypothyroidism   . Vasovagal syndrome     Current Outpatient Prescriptions on File Prior to Visit  Medication Sig Dispense Refill  . cyclobenzaprine (FLEXERIL) 10 MG tablet  Take 1 tablet (10 mg total) by mouth 3 (three) times daily as needed for muscle spasms. 60 tablet 1  . diclofenac (VOLTAREN) 75 MG EC tablet Take 1 tablet (75 mg total) by mouth 2 (two) times daily. 60 tablet 1  . diclofenac sodium (VOLTAREN) 1 % GEL Apply 4 g topically 4 (four) times daily. 5 Tube 2  . famciclovir (FAMVIR) 500 MG tablet Take 500 mg by mouth 2 (two) times daily.    . famotidine (PEPCID) 20 MG tablet Take 20  mg by mouth.    . fluticasone (FLONASE) 50 MCG/ACT nasal spray 2 sprays by Each Nare route daily as needed.     . ibandronate (BONIVA) 150 MG tablet Take 150 mg by mouth.    . levothyroxine (SYNTHROID, LEVOTHROID) 100 MCG tablet TAKE ONE TABLET BY MOUTH ONCE DAILY AS DIRECTED    . midodrine (PROAMATINE) 5 MG tablet TAKE ONE TABLET BY MOUTH THREE TIMES DAILY    . traMADol (ULTRAM) 50 MG tablet Take 1 tablet (50 mg total) by mouth every 6 (six) hours as needed. 60 tablet 0   No current facility-administered medications on file prior to visit.     Past Surgical History:  Procedure Laterality Date  . CHOLECYSTECTOMY    . THYROID SURGERY    . TONSILLECTOMY    . TONSILLECTOMY      Allergies  Allergen Reactions  . Cephalexin Anaphylaxis  . Doxycycline Rash and Shortness Of Breath  . Erythromycin Base Other (See Comments) and Shortness Of Breath    GI Upset  . Azithromycin Other (See Comments)  . Bee Venom Other (See Comments)  . Clarithromycin   . Codeine   . Darvon   . Metronidazole Other (See Comments)    Mouth sores  . Nsaids Other (See Comments)    GI Upset  . Penicillins   . Poison Oak Extract Other (See Comments)  . Sulfa Antibiotics   . Vitamin E Other (See Comments)  . Ivp Dye [Iodinated Diagnostic Agents] Nausea And Vomiting, Rash and Other (See Comments)    Dyspnea  . Prednisone Nausea And Vomiting and Rash    Social History   Social History  . Marital status: Single    Spouse name: N/A  . Number of children: N/A  . Years of education: N/A   Occupational History  . Not on file.   Social History Main Topics  . Smoking status: Never Smoker  . Smokeless tobacco: Never Used  . Alcohol use No  . Drug use: No  . Sexual activity: Not on file   Other Topics Concern  . Not on file   Social History Narrative  . No narrative on file    No family history on file.  BP 124/82   Ht 5\' 1"  (1.549 m)   Wt 132 lb (59.9 kg)   BMI 24.94 kg/m    Review of  Systems: See HPI above.    Objective:  Physical Exam:  Gen: NAD, comfortable in exam room  Left knee/leg: No gross deformity, ecchymoses, effusion. No swelling, palpable cords. Tenderness medial gastroc, distal medial hamstring. ROM 0 - 120 degrees. NV intact distally.  Assessment & Plan:  1. Left knee injury - Patellar fracture healed.  MRI performed because of continued problems with pain, instability was reassuring.  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  Doppler u/s done today to rule out DVT and was negative.  Reassured patient.  Encouraged to continue physical therapy, home exercises.  Voltaren gel if needed.  Sleeve, ACE wrap.  F/u in 2 months.

## 2016-07-30 NOTE — Telephone Encounter (Signed)
Finished

## 2016-09-03 ENCOUNTER — Ambulatory Visit (INDEPENDENT_AMBULATORY_CARE_PROVIDER_SITE_OTHER): Payer: Medicare Other | Admitting: Family Medicine

## 2016-09-03 ENCOUNTER — Encounter: Payer: Self-pay | Admitting: Family Medicine

## 2016-09-03 VITALS — BP 109/77 | HR 96 | Ht 61.0 in | Wt 135.0 lb

## 2016-09-03 DIAGNOSIS — M79605 Pain in left leg: Secondary | ICD-10-CM

## 2016-09-03 DIAGNOSIS — M722 Plantar fascial fibromatosis: Secondary | ICD-10-CM

## 2016-09-03 MED ORDER — CYCLOBENZAPRINE HCL 10 MG PO TABS: 10.0000 mg | ORAL_TABLET | Freq: Three times a day (TID) | ORAL | 1 refills | Status: DC | PRN

## 2016-09-03 NOTE — Patient Instructions (Addendum)
Follow through with the physical therapy and follow up with me in 2-3 months.  You have plantar fasciitis Take tylenol or aleve as needed for pain  Plantar fascia stretch for 20-30 seconds (do 3 of these) in morning Lowering/raise on a step exercises 3 x 10 once or twice a day - this is very important for long term recovery. Can add heel walks, toe walks forward and backward as well Ice heel for 15 minutes as needed. Avoid flat shoes/barefoot walking as much as possible. Arch straps have been shown to help with pain. Heel lifts may help with pain by avoiding fully stretching the plantar fascia except when doing home exercises. Orthotics with heel lift may be helpful. Steroid injection is a consideration for short term pain relief if you are struggling. Physical therapy is also an option - we will add this to your therapy for your knee.

## 2016-09-05 NOTE — Assessment & Plan Note (Signed)
Patellar fracture healed.  MRI was reassuring.  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  Doppler negative for DVT.  Due to start outpatient PT tomorrow - plan to see her 2-3 months after this for reevaluation.  Voltaren gel if needed.  Sleeve, ACE wrap.

## 2016-09-05 NOTE — Assessment & Plan Note (Signed)
reviewed home exercises, stretches, arch binders, arch supports.  Add PT for this also.

## 2016-09-05 NOTE — Progress Notes (Signed)
PCP: PAYNE, Beverly Gust, NP  Subjective:   HPI: Patient is a 56 y.o. female here for left knee injury.  9/26: Patient reports on 9/19 she tripped and fell forward directly onto her left knee and left side of chest. Immediate pain, difficulty bending her knee, walking. Pain is 2/10 in knee - wearing immobilizer, sharp anterior pain. Pain in left side of rib cage is 3/10 level, also sharp. Worse with a deep breath, cough, sneeze, hiccups. No skin changes, numbness.  10/12: Patient reports she still gets some left knee pain at nighttime. Dealing with plantar right foot pain at 3/10 level with mild tenderness and anteromedial right knee pain at 2/10 level from walking a lot on this one. Wearing immobilizer on left knee. Still with pain anterior left chest - starting physical therapy for this. No skin changes, numbness.  10/25: Patient reports she is doing well. Wearing immobilizer. Pain level 0/10 left knee. Taking voltaren regularly - would like to switch to the gel. Has not been weight bearing on this leg also. Doing physical therapy for left chest, right pes bursitis, plantar fasciitis. No skin changes, numbness.  11/15: Patient reports overall she is much better. Doing home PT, home exercises. Still using crutches. Left knee doesn't feel stable yet but is stronger than last visit. Using knee brace, voltaren gel - was noted to have knee brace on backwards today. Using sleeve on right knee. Worse with overuse. Pain in right foot is 1/10 and dull, worse with walking. Pain 0/10 both knees. No skin changes, numbness.  12/5: Patient reports she's having trouble bearing weight on her injured left knee. Doing physical therapy. Recalls walking too much on Saturday at [facility] - had difficulty getting back to exit after this. Has improved some since then - pain down to 1/10. Using crutches. Has been icing. Due to start aquatic therapy tomorrow. No skin changes,  numbness.  07/06/16: Patient reports she has improved but still feels weak. Does not trust left knee and will give out if walking long distance. Has 2/10 level of pain when walking. Right knee only a little soreness anteriorly. Also with pain 1/10 level left IT band and this feels tight. Back pain to 4/10 level. No skin changes, numbness. Doing home PT and will be transitioning to outpatient PT and aqua therapy.  1/19: Patient came in today because she has had increased calf pain, noticed a knot posterior calf as well. Pain is a soreness, worse to the touch. No numbness or tingling. Worse with walking. No skin changes, redness.  3/8: Patient returns having not started new physical therapy yet - due to start tomorrow. Still with pain in left knee, very weak,gives out. Pain level 3/10. Also with pain right plantar area of foot. No skin changes, numbness.  Past Medical History:  Diagnosis Date  . Abdominal tenderness   . Anorexia nervosa   . Anxiety state, unspecified   . Attention deficit disorder with hyperactivity(314.01)   . Benign neoplasm of choroid    Right  . Borderline personality disorder   . Carbuncle and furuncle of trunk   . Contact dermatitis and other eczema, due to unspecified cause   . Contusion of knee   . Esophageal reflux   . Mastodynia   . Osteoporosis, unspecified   . Painful respiration   . Swelling, mass, or lump in chest   . Thyroid disease   . 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 Transient  disorder of initiating or maintaining sleep   . Unspecified hypothyroidism   . Vasovagal syndrome     Current Outpatient Prescriptions on File Prior to Visit  Medication Sig Dispense Refill  . diclofenac (VOLTAREN) 75 MG EC tablet Take 1 tablet (75 mg total) by mouth 2 (two) times daily. 60 tablet 1  . diclofenac sodium (VOLTAREN) 1 % GEL Apply 4 g topically 4 (four) times daily. 5 Tube 2  . famciclovir (FAMVIR) 500 MG  tablet Take 500 mg by mouth 2 (two) times daily.    . famotidine (PEPCID) 20 MG tablet Take 20 mg by mouth.    . fluticasone (FLONASE) 50 MCG/ACT nasal spray 2 sprays by Each Nare route daily as needed.     . ibandronate (BONIVA) 150 MG tablet Take 150 mg by mouth.    . levothyroxine (SYNTHROID, LEVOTHROID) 100 MCG tablet TAKE ONE TABLET BY MOUTH ONCE DAILY AS DIRECTED    . midodrine (PROAMATINE) 5 MG tablet TAKE ONE TABLET BY MOUTH THREE TIMES DAILY    . traMADol (ULTRAM) 50 MG tablet Take 1 tablet (50 mg total) by mouth every 6 (six) hours as needed. 60 tablet 0   No current facility-administered medications on file prior to visit.     Past Surgical History:  Procedure Laterality Date  . CHOLECYSTECTOMY    . THYROID SURGERY    . TONSILLECTOMY    . TONSILLECTOMY      Allergies  Allergen Reactions  . Cephalexin Anaphylaxis  . Doxycycline Rash and Shortness Of Breath  . Erythromycin Base Other (See Comments) and Shortness Of Breath    GI Upset  . Azithromycin Other (See Comments)  . Bee Venom Other (See Comments)  . Clarithromycin   . Codeine   . Darvon   . Metronidazole Other (See Comments)    Mouth sores  . Nsaids Other (See Comments)    GI Upset  . Penicillins   . Poison Oak Extract Other (See Comments)  . Sulfa Antibiotics   . Vitamin E Other (See Comments)  . Ivp Dye [Iodinated Diagnostic Agents] Nausea And Vomiting, Rash and Other (See Comments)    Dyspnea  . Prednisone Nausea And Vomiting and Rash    Social History   Social History  . Marital status: Single    Spouse name: N/A  . Number of children: N/A  . Years of education: N/A   Occupational History  . Not on file.   Social History Main Topics  . Smoking status: Never Smoker  . Smokeless tobacco: Never Used  . Alcohol use No  . Drug use: No  . Sexual activity: Not on file   Other Topics Concern  . Not on file   Social History Narrative  . No narrative on file    No family history on  file.  BP 109/77   Pulse 96   Ht 5\' 1"  (1.549 m)   Wt 135 lb (61.2 kg)   BMI 25.51 kg/m   Review of Systems: See HPI above.    Objective:  Physical Exam:  Gen: NAD, comfortable in exam room  Left knee/leg: No gross deformity, ecchymoses, effusion. No swelling, palpable cords. Tenderness medial gastroc, distal medial hamstring. ROM 0 - 120 degrees. NV intact distally.  Right foot/ankle: No gross deformity, swelling, ecchymoses FROM TTP plantar fascia at insertion medial calcaneus. Negative ant drawer and talar tilt.   Negative syndesmotic compression. Thompsons test negative. NV intact distally.  Assessment & Plan:  1. Left knee injury -  Patellar fracture healed.  MRI was reassuring.  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  Doppler negative for DVT.  Due to start outpatient PT tomorrow - plan to see her 2-3 months after this for reevaluation.  Voltaren gel if needed.  Sleeve, ACE wrap.   2. Plantar fasciitis - reviewed home exercises, stretches, arch binders, arch supports.  Add PT for this also.

## 2016-09-07 DIAGNOSIS — R2689 Other abnormalities of gait and mobility: Secondary | ICD-10-CM | POA: Diagnosis not present

## 2016-09-07 DIAGNOSIS — M25562 Pain in left knee: Secondary | ICD-10-CM | POA: Diagnosis not present

## 2016-09-07 DIAGNOSIS — M25662 Stiffness of left knee, not elsewhere classified: Secondary | ICD-10-CM | POA: Diagnosis not present

## 2016-09-07 DIAGNOSIS — M25561 Pain in right knee: Secondary | ICD-10-CM | POA: Diagnosis not present

## 2016-09-07 DIAGNOSIS — M25661 Stiffness of right knee, not elsewhere classified: Secondary | ICD-10-CM | POA: Diagnosis not present

## 2016-09-08 DIAGNOSIS — M25561 Pain in right knee: Secondary | ICD-10-CM | POA: Diagnosis not present

## 2016-09-08 DIAGNOSIS — R2689 Other abnormalities of gait and mobility: Secondary | ICD-10-CM | POA: Diagnosis not present

## 2016-09-08 DIAGNOSIS — M25562 Pain in left knee: Secondary | ICD-10-CM | POA: Diagnosis not present

## 2016-09-08 DIAGNOSIS — M25661 Stiffness of right knee, not elsewhere classified: Secondary | ICD-10-CM | POA: Diagnosis not present

## 2016-09-08 DIAGNOSIS — M25662 Stiffness of left knee, not elsewhere classified: Secondary | ICD-10-CM | POA: Diagnosis not present

## 2016-09-09 DIAGNOSIS — M25562 Pain in left knee: Secondary | ICD-10-CM | POA: Diagnosis not present

## 2016-09-09 DIAGNOSIS — M25561 Pain in right knee: Secondary | ICD-10-CM | POA: Diagnosis not present

## 2016-09-09 DIAGNOSIS — M25662 Stiffness of left knee, not elsewhere classified: Secondary | ICD-10-CM | POA: Diagnosis not present

## 2016-09-09 DIAGNOSIS — R2689 Other abnormalities of gait and mobility: Secondary | ICD-10-CM | POA: Diagnosis not present

## 2016-09-09 DIAGNOSIS — M25661 Stiffness of right knee, not elsewhere classified: Secondary | ICD-10-CM | POA: Diagnosis not present

## 2016-09-15 DIAGNOSIS — M25562 Pain in left knee: Secondary | ICD-10-CM | POA: Diagnosis not present

## 2016-09-15 DIAGNOSIS — M25661 Stiffness of right knee, not elsewhere classified: Secondary | ICD-10-CM | POA: Diagnosis not present

## 2016-09-15 DIAGNOSIS — M25561 Pain in right knee: Secondary | ICD-10-CM | POA: Diagnosis not present

## 2016-09-15 DIAGNOSIS — M25662 Stiffness of left knee, not elsewhere classified: Secondary | ICD-10-CM | POA: Diagnosis not present

## 2016-09-15 DIAGNOSIS — R2689 Other abnormalities of gait and mobility: Secondary | ICD-10-CM | POA: Diagnosis not present

## 2016-09-16 DIAGNOSIS — M25562 Pain in left knee: Secondary | ICD-10-CM | POA: Diagnosis not present

## 2016-09-16 DIAGNOSIS — R2689 Other abnormalities of gait and mobility: Secondary | ICD-10-CM | POA: Diagnosis not present

## 2016-09-16 DIAGNOSIS — M25662 Stiffness of left knee, not elsewhere classified: Secondary | ICD-10-CM | POA: Diagnosis not present

## 2016-09-16 DIAGNOSIS — M25661 Stiffness of right knee, not elsewhere classified: Secondary | ICD-10-CM | POA: Diagnosis not present

## 2016-09-16 DIAGNOSIS — M25561 Pain in right knee: Secondary | ICD-10-CM | POA: Diagnosis not present

## 2016-09-22 DIAGNOSIS — M25662 Stiffness of left knee, not elsewhere classified: Secondary | ICD-10-CM | POA: Diagnosis not present

## 2016-09-22 DIAGNOSIS — M25661 Stiffness of right knee, not elsewhere classified: Secondary | ICD-10-CM | POA: Diagnosis not present

## 2016-09-22 DIAGNOSIS — R2689 Other abnormalities of gait and mobility: Secondary | ICD-10-CM | POA: Diagnosis not present

## 2016-09-22 DIAGNOSIS — M25561 Pain in right knee: Secondary | ICD-10-CM | POA: Diagnosis not present

## 2016-09-22 DIAGNOSIS — M25562 Pain in left knee: Secondary | ICD-10-CM | POA: Diagnosis not present

## 2016-09-30 DIAGNOSIS — M25561 Pain in right knee: Secondary | ICD-10-CM | POA: Diagnosis not present

## 2016-09-30 DIAGNOSIS — M25562 Pain in left knee: Secondary | ICD-10-CM | POA: Diagnosis not present

## 2016-09-30 DIAGNOSIS — M25661 Stiffness of right knee, not elsewhere classified: Secondary | ICD-10-CM | POA: Diagnosis not present

## 2016-09-30 DIAGNOSIS — M25662 Stiffness of left knee, not elsewhere classified: Secondary | ICD-10-CM | POA: Diagnosis not present

## 2016-09-30 DIAGNOSIS — R2689 Other abnormalities of gait and mobility: Secondary | ICD-10-CM | POA: Diagnosis not present

## 2016-10-01 DIAGNOSIS — M25561 Pain in right knee: Secondary | ICD-10-CM | POA: Diagnosis not present

## 2016-10-01 DIAGNOSIS — M25661 Stiffness of right knee, not elsewhere classified: Secondary | ICD-10-CM | POA: Diagnosis not present

## 2016-10-01 DIAGNOSIS — M25562 Pain in left knee: Secondary | ICD-10-CM | POA: Diagnosis not present

## 2016-10-01 DIAGNOSIS — M25662 Stiffness of left knee, not elsewhere classified: Secondary | ICD-10-CM | POA: Diagnosis not present

## 2016-10-01 DIAGNOSIS — R2689 Other abnormalities of gait and mobility: Secondary | ICD-10-CM | POA: Diagnosis not present

## 2016-10-05 DIAGNOSIS — Z7409 Other reduced mobility: Secondary | ICD-10-CM | POA: Diagnosis not present

## 2016-10-05 DIAGNOSIS — M25662 Stiffness of left knee, not elsewhere classified: Secondary | ICD-10-CM | POA: Diagnosis not present

## 2016-10-05 DIAGNOSIS — M25661 Stiffness of right knee, not elsewhere classified: Secondary | ICD-10-CM | POA: Diagnosis not present

## 2016-10-05 DIAGNOSIS — R2681 Unsteadiness on feet: Secondary | ICD-10-CM | POA: Diagnosis not present

## 2016-10-05 DIAGNOSIS — M25562 Pain in left knee: Secondary | ICD-10-CM | POA: Diagnosis not present

## 2016-10-05 DIAGNOSIS — M25561 Pain in right knee: Secondary | ICD-10-CM | POA: Diagnosis not present

## 2016-10-08 DIAGNOSIS — M25661 Stiffness of right knee, not elsewhere classified: Secondary | ICD-10-CM | POA: Diagnosis not present

## 2016-10-08 DIAGNOSIS — M25662 Stiffness of left knee, not elsewhere classified: Secondary | ICD-10-CM | POA: Diagnosis not present

## 2016-10-08 DIAGNOSIS — Z7409 Other reduced mobility: Secondary | ICD-10-CM | POA: Diagnosis not present

## 2016-10-08 DIAGNOSIS — R2681 Unsteadiness on feet: Secondary | ICD-10-CM | POA: Diagnosis not present

## 2016-10-08 DIAGNOSIS — M25562 Pain in left knee: Secondary | ICD-10-CM | POA: Diagnosis not present

## 2016-10-08 DIAGNOSIS — M25561 Pain in right knee: Secondary | ICD-10-CM | POA: Diagnosis not present

## 2016-10-12 DIAGNOSIS — R2681 Unsteadiness on feet: Secondary | ICD-10-CM | POA: Diagnosis not present

## 2016-10-12 DIAGNOSIS — M25661 Stiffness of right knee, not elsewhere classified: Secondary | ICD-10-CM | POA: Diagnosis not present

## 2016-10-12 DIAGNOSIS — Z7409 Other reduced mobility: Secondary | ICD-10-CM | POA: Diagnosis not present

## 2016-10-12 DIAGNOSIS — M25662 Stiffness of left knee, not elsewhere classified: Secondary | ICD-10-CM | POA: Diagnosis not present

## 2016-10-12 DIAGNOSIS — M25562 Pain in left knee: Secondary | ICD-10-CM | POA: Diagnosis not present

## 2016-10-12 DIAGNOSIS — M25561 Pain in right knee: Secondary | ICD-10-CM | POA: Diagnosis not present

## 2016-10-15 DIAGNOSIS — Z7409 Other reduced mobility: Secondary | ICD-10-CM | POA: Diagnosis not present

## 2016-10-15 DIAGNOSIS — M25561 Pain in right knee: Secondary | ICD-10-CM | POA: Diagnosis not present

## 2016-10-15 DIAGNOSIS — M25662 Stiffness of left knee, not elsewhere classified: Secondary | ICD-10-CM | POA: Diagnosis not present

## 2016-10-15 DIAGNOSIS — M25661 Stiffness of right knee, not elsewhere classified: Secondary | ICD-10-CM | POA: Diagnosis not present

## 2016-10-15 DIAGNOSIS — R2681 Unsteadiness on feet: Secondary | ICD-10-CM | POA: Diagnosis not present

## 2016-10-15 DIAGNOSIS — M25562 Pain in left knee: Secondary | ICD-10-CM | POA: Diagnosis not present

## 2016-10-19 DIAGNOSIS — M25562 Pain in left knee: Secondary | ICD-10-CM | POA: Diagnosis not present

## 2016-10-19 DIAGNOSIS — M25561 Pain in right knee: Secondary | ICD-10-CM | POA: Diagnosis not present

## 2016-10-19 DIAGNOSIS — R2681 Unsteadiness on feet: Secondary | ICD-10-CM | POA: Diagnosis not present

## 2016-10-19 DIAGNOSIS — M25662 Stiffness of left knee, not elsewhere classified: Secondary | ICD-10-CM | POA: Diagnosis not present

## 2016-10-19 DIAGNOSIS — M25661 Stiffness of right knee, not elsewhere classified: Secondary | ICD-10-CM | POA: Diagnosis not present

## 2016-10-19 DIAGNOSIS — Z7409 Other reduced mobility: Secondary | ICD-10-CM | POA: Diagnosis not present

## 2016-10-21 DIAGNOSIS — M25561 Pain in right knee: Secondary | ICD-10-CM | POA: Diagnosis not present

## 2016-10-21 DIAGNOSIS — M25562 Pain in left knee: Secondary | ICD-10-CM | POA: Diagnosis not present

## 2016-10-21 DIAGNOSIS — M25662 Stiffness of left knee, not elsewhere classified: Secondary | ICD-10-CM | POA: Diagnosis not present

## 2016-10-21 DIAGNOSIS — R2681 Unsteadiness on feet: Secondary | ICD-10-CM | POA: Diagnosis not present

## 2016-10-21 DIAGNOSIS — M25661 Stiffness of right knee, not elsewhere classified: Secondary | ICD-10-CM | POA: Diagnosis not present

## 2016-10-21 DIAGNOSIS — Z7409 Other reduced mobility: Secondary | ICD-10-CM | POA: Diagnosis not present

## 2016-10-23 DIAGNOSIS — M25661 Stiffness of right knee, not elsewhere classified: Secondary | ICD-10-CM | POA: Diagnosis not present

## 2016-10-23 DIAGNOSIS — M25561 Pain in right knee: Secondary | ICD-10-CM | POA: Diagnosis not present

## 2016-10-23 DIAGNOSIS — R2681 Unsteadiness on feet: Secondary | ICD-10-CM | POA: Diagnosis not present

## 2016-10-23 DIAGNOSIS — M25562 Pain in left knee: Secondary | ICD-10-CM | POA: Diagnosis not present

## 2016-10-23 DIAGNOSIS — M25662 Stiffness of left knee, not elsewhere classified: Secondary | ICD-10-CM | POA: Diagnosis not present

## 2016-10-23 DIAGNOSIS — Z7409 Other reduced mobility: Secondary | ICD-10-CM | POA: Diagnosis not present

## 2016-10-26 DIAGNOSIS — M25662 Stiffness of left knee, not elsewhere classified: Secondary | ICD-10-CM | POA: Diagnosis not present

## 2016-10-26 DIAGNOSIS — R2681 Unsteadiness on feet: Secondary | ICD-10-CM | POA: Diagnosis not present

## 2016-10-26 DIAGNOSIS — M25562 Pain in left knee: Secondary | ICD-10-CM | POA: Diagnosis not present

## 2016-10-26 DIAGNOSIS — Z7409 Other reduced mobility: Secondary | ICD-10-CM | POA: Diagnosis not present

## 2016-10-26 DIAGNOSIS — M25661 Stiffness of right knee, not elsewhere classified: Secondary | ICD-10-CM | POA: Diagnosis not present

## 2016-10-26 DIAGNOSIS — M25561 Pain in right knee: Secondary | ICD-10-CM | POA: Diagnosis not present

## 2016-10-28 DIAGNOSIS — M25561 Pain in right knee: Secondary | ICD-10-CM | POA: Diagnosis not present

## 2016-10-28 DIAGNOSIS — M25661 Stiffness of right knee, not elsewhere classified: Secondary | ICD-10-CM | POA: Diagnosis not present

## 2016-10-28 DIAGNOSIS — M25662 Stiffness of left knee, not elsewhere classified: Secondary | ICD-10-CM | POA: Diagnosis not present

## 2016-10-28 DIAGNOSIS — M25562 Pain in left knee: Secondary | ICD-10-CM | POA: Diagnosis not present

## 2016-10-28 DIAGNOSIS — Z7409 Other reduced mobility: Secondary | ICD-10-CM | POA: Diagnosis not present

## 2016-10-28 DIAGNOSIS — R2681 Unsteadiness on feet: Secondary | ICD-10-CM | POA: Diagnosis not present

## 2016-10-30 DIAGNOSIS — M25662 Stiffness of left knee, not elsewhere classified: Secondary | ICD-10-CM | POA: Diagnosis not present

## 2016-10-30 DIAGNOSIS — M25661 Stiffness of right knee, not elsewhere classified: Secondary | ICD-10-CM | POA: Diagnosis not present

## 2016-10-30 DIAGNOSIS — Z7409 Other reduced mobility: Secondary | ICD-10-CM | POA: Diagnosis not present

## 2016-10-30 DIAGNOSIS — M25561 Pain in right knee: Secondary | ICD-10-CM | POA: Diagnosis not present

## 2016-10-30 DIAGNOSIS — M25562 Pain in left knee: Secondary | ICD-10-CM | POA: Diagnosis not present

## 2016-10-30 DIAGNOSIS — R2681 Unsteadiness on feet: Secondary | ICD-10-CM | POA: Diagnosis not present

## 2016-11-09 DIAGNOSIS — M25561 Pain in right knee: Secondary | ICD-10-CM | POA: Diagnosis not present

## 2016-11-09 DIAGNOSIS — M25562 Pain in left knee: Secondary | ICD-10-CM | POA: Diagnosis not present

## 2016-11-12 DIAGNOSIS — M25561 Pain in right knee: Secondary | ICD-10-CM | POA: Diagnosis not present

## 2016-11-12 DIAGNOSIS — M25562 Pain in left knee: Secondary | ICD-10-CM | POA: Diagnosis not present

## 2016-12-16 DIAGNOSIS — M25561 Pain in right knee: Secondary | ICD-10-CM | POA: Diagnosis not present

## 2016-12-16 DIAGNOSIS — M25562 Pain in left knee: Secondary | ICD-10-CM | POA: Diagnosis not present

## 2016-12-16 DIAGNOSIS — Z7409 Other reduced mobility: Secondary | ICD-10-CM | POA: Diagnosis not present

## 2016-12-16 DIAGNOSIS — R2681 Unsteadiness on feet: Secondary | ICD-10-CM | POA: Diagnosis not present

## 2016-12-21 DIAGNOSIS — Z7409 Other reduced mobility: Secondary | ICD-10-CM | POA: Diagnosis not present

## 2016-12-21 DIAGNOSIS — M25562 Pain in left knee: Secondary | ICD-10-CM | POA: Diagnosis not present

## 2016-12-21 DIAGNOSIS — R2681 Unsteadiness on feet: Secondary | ICD-10-CM | POA: Diagnosis not present

## 2016-12-21 DIAGNOSIS — M25561 Pain in right knee: Secondary | ICD-10-CM | POA: Diagnosis not present

## 2016-12-25 DIAGNOSIS — R2681 Unsteadiness on feet: Secondary | ICD-10-CM | POA: Diagnosis not present

## 2016-12-25 DIAGNOSIS — M25562 Pain in left knee: Secondary | ICD-10-CM | POA: Diagnosis not present

## 2016-12-25 DIAGNOSIS — M25561 Pain in right knee: Secondary | ICD-10-CM | POA: Diagnosis not present

## 2016-12-25 DIAGNOSIS — Z7409 Other reduced mobility: Secondary | ICD-10-CM | POA: Diagnosis not present

## 2017-01-04 IMAGING — DX DG KNEE 1-2V*L*
2 series · 2 of 2 positions shown · non-contrast
Comparison: 03/24/2016

CLINICAL DATA: Fell 3 weeks ago and fractured patella. Followup
fracture.

EXAM:
LEFT KNEE - 1-2 VIEW

[knee ap]
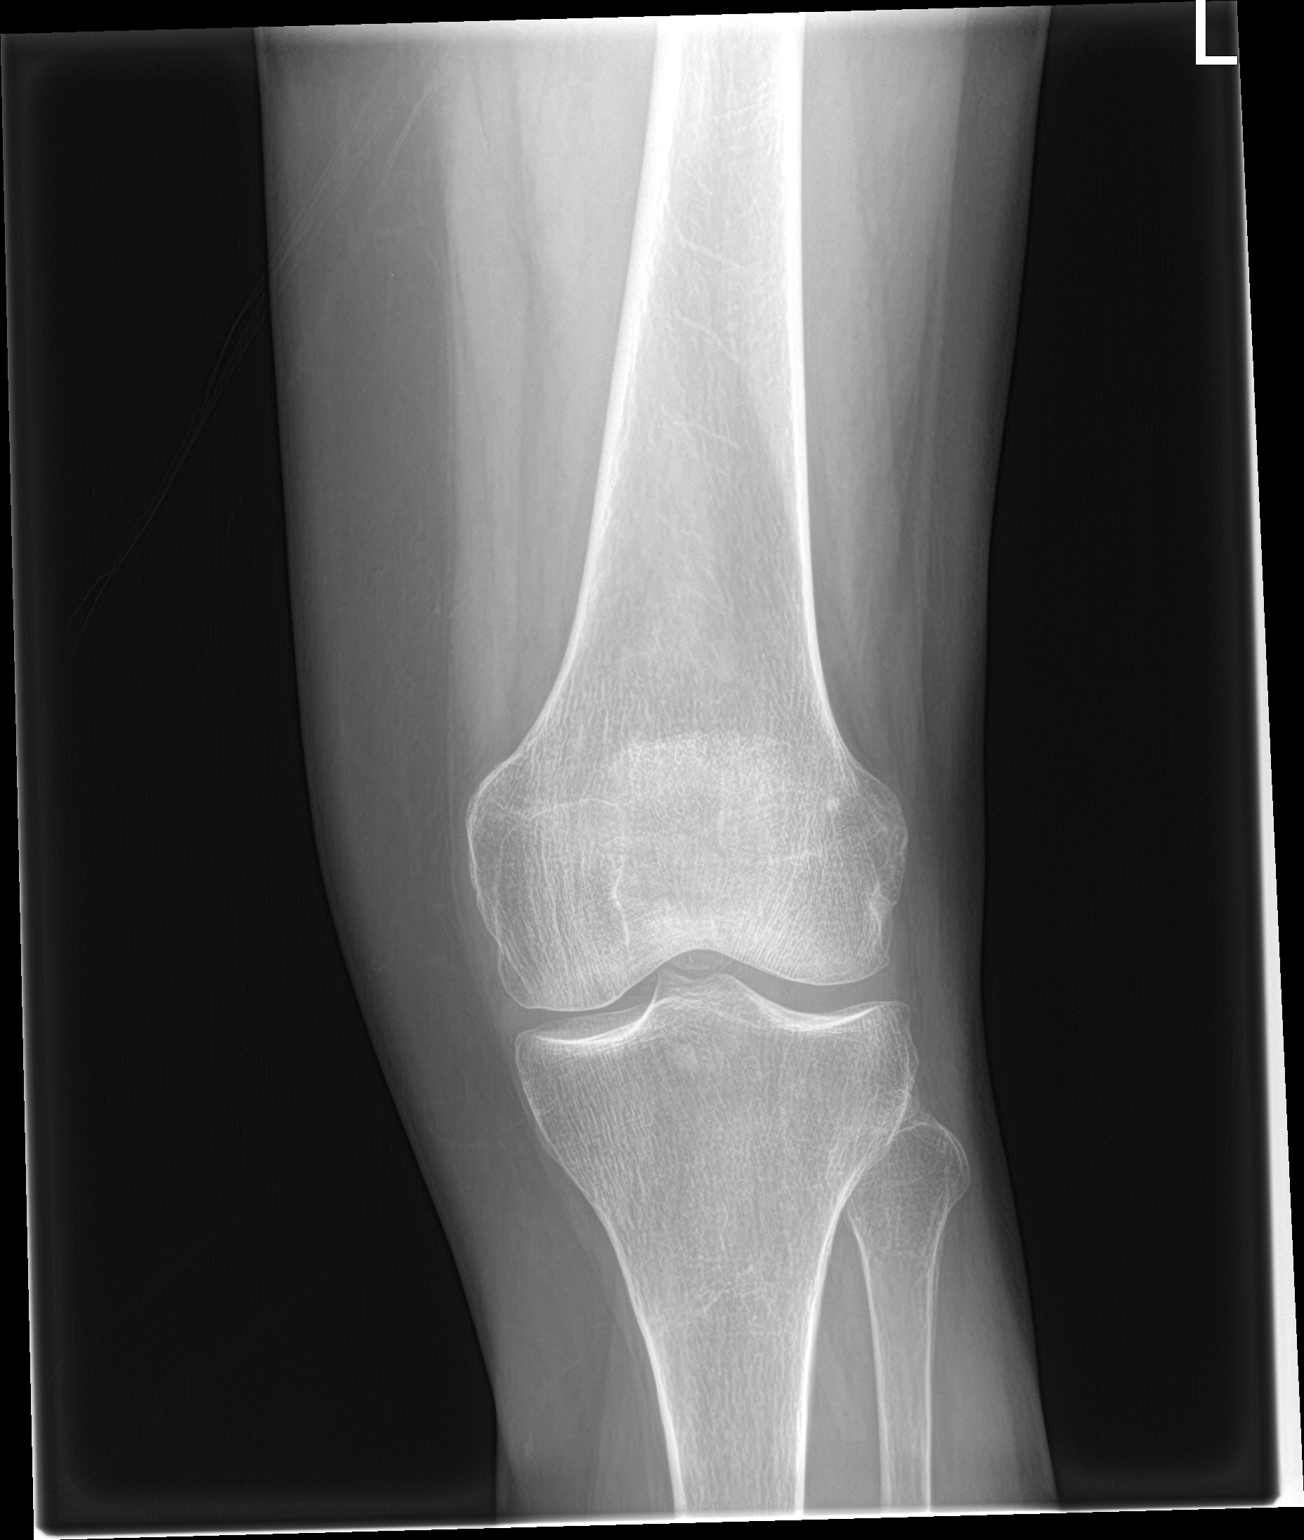

[knee lat]
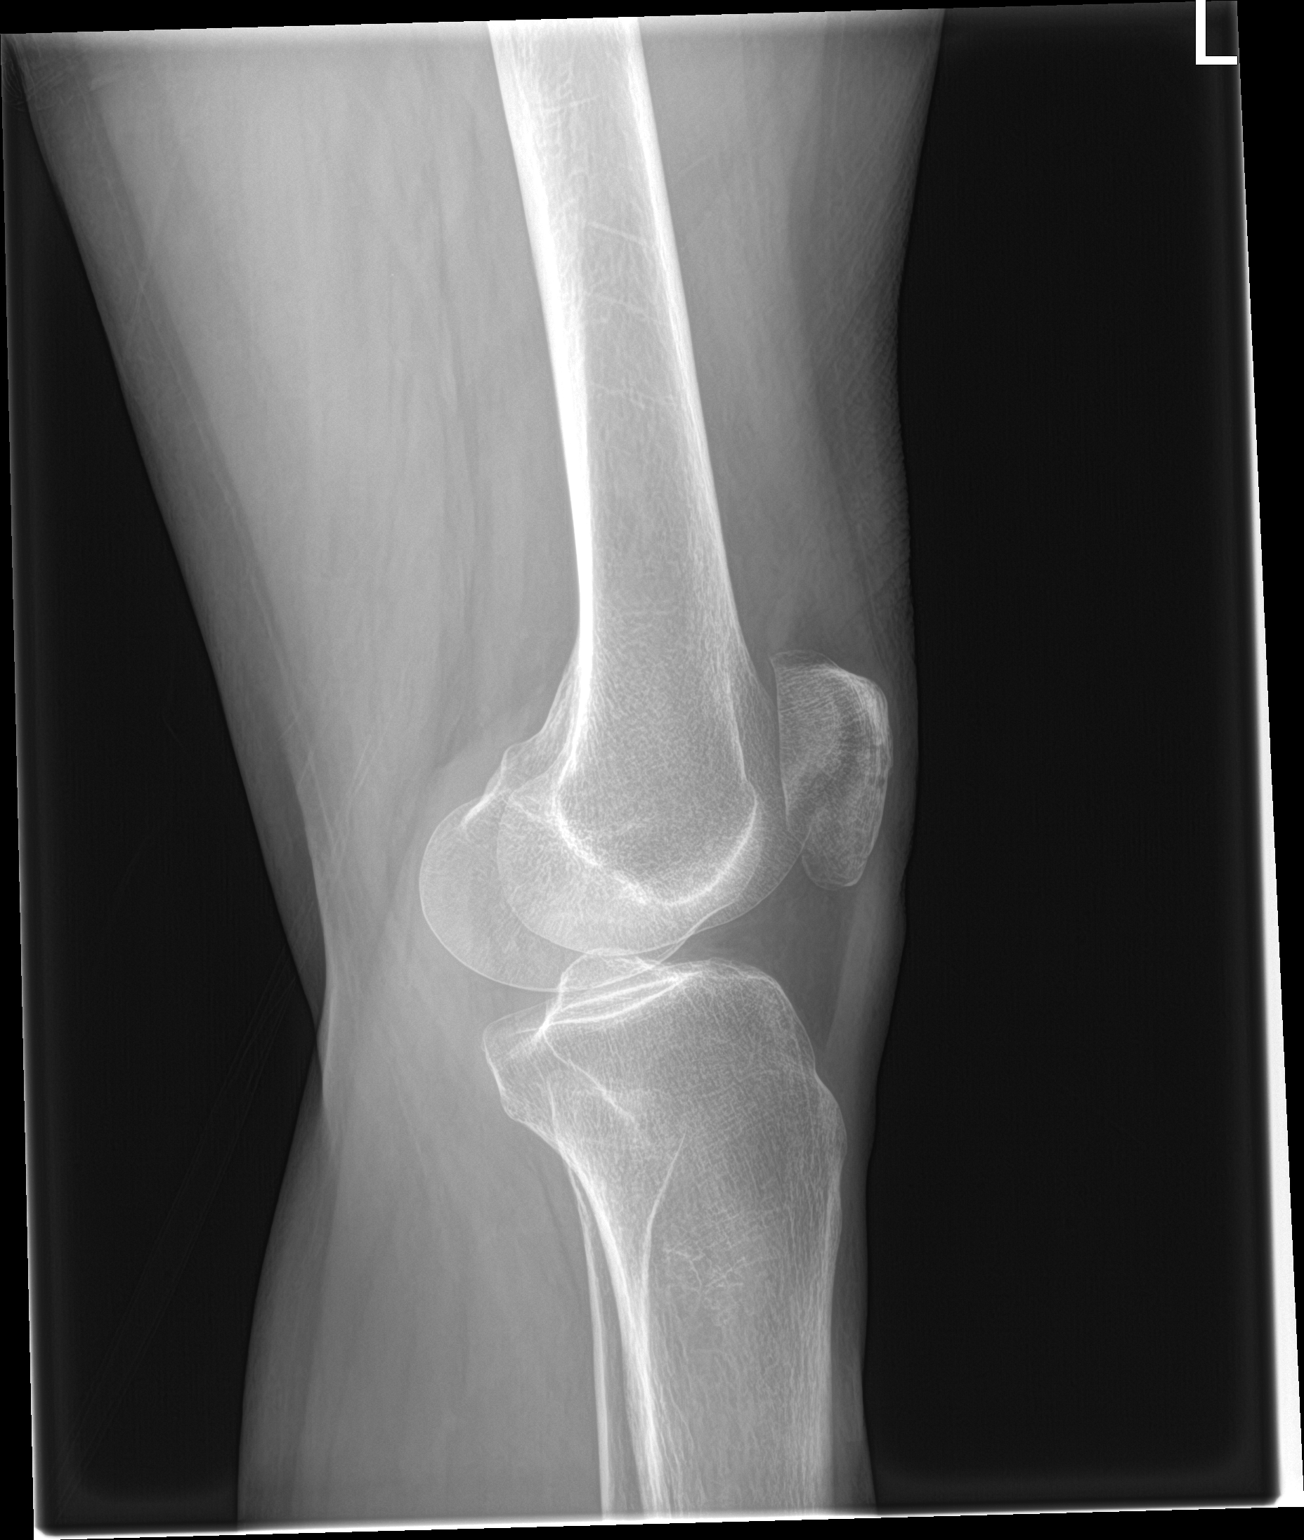

[2 of 2 positions shown; findings below may reference images not displayed]

FINDINGS: Interval healing of the nondisplaced patellar fracture. The joint
spaces are maintained. No other bony abnormalities.
IMPRESSION: Healed patellar fracture.

## 2017-01-05 DIAGNOSIS — M25561 Pain in right knee: Secondary | ICD-10-CM | POA: Diagnosis not present

## 2017-01-05 DIAGNOSIS — M25662 Stiffness of left knee, not elsewhere classified: Secondary | ICD-10-CM | POA: Diagnosis not present

## 2017-01-05 DIAGNOSIS — M25562 Pain in left knee: Secondary | ICD-10-CM | POA: Diagnosis not present

## 2017-01-05 DIAGNOSIS — R2689 Other abnormalities of gait and mobility: Secondary | ICD-10-CM | POA: Diagnosis not present

## 2017-01-05 DIAGNOSIS — M25661 Stiffness of right knee, not elsewhere classified: Secondary | ICD-10-CM | POA: Diagnosis not present

## 2017-01-11 DIAGNOSIS — R2689 Other abnormalities of gait and mobility: Secondary | ICD-10-CM | POA: Diagnosis not present

## 2017-01-11 DIAGNOSIS — M25662 Stiffness of left knee, not elsewhere classified: Secondary | ICD-10-CM | POA: Diagnosis not present

## 2017-01-11 DIAGNOSIS — M25562 Pain in left knee: Secondary | ICD-10-CM | POA: Diagnosis not present

## 2017-01-11 DIAGNOSIS — M25561 Pain in right knee: Secondary | ICD-10-CM | POA: Diagnosis not present

## 2017-01-11 DIAGNOSIS — M25661 Stiffness of right knee, not elsewhere classified: Secondary | ICD-10-CM | POA: Diagnosis not present

## 2017-01-24 ENCOUNTER — Emergency Department (HOSPITAL_BASED_OUTPATIENT_CLINIC_OR_DEPARTMENT_OTHER): Payer: Medicare Other

## 2017-01-24 ENCOUNTER — Emergency Department (HOSPITAL_BASED_OUTPATIENT_CLINIC_OR_DEPARTMENT_OTHER)
Admission: EM | Admit: 2017-01-24 | Discharge: 2017-01-24 | Disposition: A | Discharge status: Home / Self Care | Payer: Medicare Other | Attending: Emergency Medicine | Admitting: Emergency Medicine

## 2017-01-24 ENCOUNTER — Encounter (HOSPITAL_BASED_OUTPATIENT_CLINIC_OR_DEPARTMENT_OTHER): Payer: Self-pay | Admitting: Emergency Medicine

## 2017-01-24 DIAGNOSIS — R42 Dizziness and giddiness: Secondary | ICD-10-CM | POA: Insufficient documentation

## 2017-01-24 DIAGNOSIS — R200 Anesthesia of skin: Secondary | ICD-10-CM | POA: Insufficient documentation

## 2017-01-24 DIAGNOSIS — R079 Chest pain, unspecified: Secondary | ICD-10-CM | POA: Insufficient documentation

## 2017-01-24 DIAGNOSIS — Z5321 Procedure and treatment not carried out due to patient leaving prior to being seen by health care provider: Secondary | ICD-10-CM | POA: Insufficient documentation

## 2017-01-24 NOTE — ED Triage Notes (Signed)
PT presents with c/o intermittent chest pain dizziness vomiting X 1 all since yesterday, numbness in her legs.

## 2017-01-25 DIAGNOSIS — E039 Hypothyroidism, unspecified: Secondary | ICD-10-CM | POA: Diagnosis not present

## 2017-01-25 DIAGNOSIS — I959 Hypotension, unspecified: Secondary | ICD-10-CM | POA: Diagnosis not present

## 2017-01-29 DIAGNOSIS — E0590 Thyrotoxicosis, unspecified without thyrotoxic crisis or storm: Secondary | ICD-10-CM | POA: Diagnosis not present

## 2017-01-29 DIAGNOSIS — F410 Panic disorder [episodic paroxysmal anxiety] without agoraphobia: Secondary | ICD-10-CM | POA: Diagnosis not present

## 2017-01-29 DIAGNOSIS — I471 Supraventricular tachycardia: Secondary | ICD-10-CM | POA: Diagnosis not present

## 2017-01-29 DIAGNOSIS — I951 Orthostatic hypotension: Secondary | ICD-10-CM | POA: Diagnosis not present

## 2017-01-29 DIAGNOSIS — R55 Syncope and collapse: Secondary | ICD-10-CM | POA: Diagnosis not present

## 2017-01-31 DIAGNOSIS — E039 Hypothyroidism, unspecified: Secondary | ICD-10-CM | POA: Diagnosis not present

## 2017-01-31 DIAGNOSIS — F419 Anxiety disorder, unspecified: Secondary | ICD-10-CM | POA: Diagnosis not present

## 2017-01-31 DIAGNOSIS — H5712 Ocular pain, left eye: Secondary | ICD-10-CM | POA: Diagnosis not present

## 2017-01-31 DIAGNOSIS — R002 Palpitations: Secondary | ICD-10-CM | POA: Diagnosis not present

## 2017-01-31 DIAGNOSIS — E0500 Thyrotoxicosis with diffuse goiter without thyrotoxic crisis or storm: Secondary | ICD-10-CM | POA: Diagnosis not present

## 2017-01-31 DIAGNOSIS — R51 Headache: Secondary | ICD-10-CM | POA: Diagnosis not present

## 2017-01-31 DIAGNOSIS — R42 Dizziness and giddiness: Secondary | ICD-10-CM | POA: Diagnosis not present

## 2017-01-31 DIAGNOSIS — I951 Orthostatic hypotension: Secondary | ICD-10-CM | POA: Diagnosis not present

## 2017-02-16 DIAGNOSIS — H18832 Recurrent erosion of cornea, left eye: Secondary | ICD-10-CM | POA: Diagnosis not present

## 2017-02-16 DIAGNOSIS — H52203 Unspecified astigmatism, bilateral: Secondary | ICD-10-CM | POA: Diagnosis not present

## 2017-02-16 DIAGNOSIS — H524 Presbyopia: Secondary | ICD-10-CM | POA: Diagnosis not present

## 2017-02-16 DIAGNOSIS — H5213 Myopia, bilateral: Secondary | ICD-10-CM | POA: Diagnosis not present

## 2017-02-16 DIAGNOSIS — D3131 Benign neoplasm of right choroid: Secondary | ICD-10-CM | POA: Diagnosis not present

## 2017-02-23 DIAGNOSIS — R112 Nausea with vomiting, unspecified: Secondary | ICD-10-CM | POA: Diagnosis not present

## 2017-02-23 DIAGNOSIS — K2970 Gastritis, unspecified, without bleeding: Secondary | ICD-10-CM | POA: Diagnosis not present

## 2017-02-23 DIAGNOSIS — R1012 Left upper quadrant pain: Secondary | ICD-10-CM | POA: Diagnosis not present

## 2017-02-23 DIAGNOSIS — R109 Unspecified abdominal pain: Secondary | ICD-10-CM | POA: Diagnosis not present

## 2017-02-23 DIAGNOSIS — R1110 Vomiting, unspecified: Secondary | ICD-10-CM | POA: Diagnosis not present

## 2017-02-23 DIAGNOSIS — R197 Diarrhea, unspecified: Secondary | ICD-10-CM | POA: Diagnosis not present

## 2017-02-24 DIAGNOSIS — R109 Unspecified abdominal pain: Secondary | ICD-10-CM | POA: Diagnosis not present

## 2017-02-25 DIAGNOSIS — R109 Unspecified abdominal pain: Secondary | ICD-10-CM | POA: Diagnosis not present

## 2017-03-04 DIAGNOSIS — M150 Primary generalized (osteo)arthritis: Secondary | ICD-10-CM | POA: Diagnosis not present

## 2017-03-04 DIAGNOSIS — E039 Hypothyroidism, unspecified: Secondary | ICD-10-CM | POA: Diagnosis not present

## 2017-03-04 DIAGNOSIS — R002 Palpitations: Secondary | ICD-10-CM | POA: Diagnosis not present

## 2017-03-04 DIAGNOSIS — K219 Gastro-esophageal reflux disease without esophagitis: Secondary | ICD-10-CM | POA: Diagnosis not present

## 2017-03-04 DIAGNOSIS — N3001 Acute cystitis with hematuria: Secondary | ICD-10-CM | POA: Diagnosis not present

## 2017-03-11 DIAGNOSIS — H18832 Recurrent erosion of cornea, left eye: Secondary | ICD-10-CM | POA: Diagnosis not present

## 2017-03-11 DIAGNOSIS — H5712 Ocular pain, left eye: Secondary | ICD-10-CM | POA: Diagnosis not present

## 2017-03-15 DIAGNOSIS — H18832 Recurrent erosion of cornea, left eye: Secondary | ICD-10-CM | POA: Diagnosis not present

## 2017-03-19 DIAGNOSIS — H18832 Recurrent erosion of cornea, left eye: Secondary | ICD-10-CM | POA: Diagnosis not present

## 2017-03-22 DIAGNOSIS — H18832 Recurrent erosion of cornea, left eye: Secondary | ICD-10-CM | POA: Diagnosis not present

## 2017-03-23 DIAGNOSIS — H18832 Recurrent erosion of cornea, left eye: Secondary | ICD-10-CM | POA: Diagnosis not present

## 2017-03-29 DIAGNOSIS — H18832 Recurrent erosion of cornea, left eye: Secondary | ICD-10-CM | POA: Diagnosis not present

## 2017-04-05 DIAGNOSIS — N390 Urinary tract infection, site not specified: Secondary | ICD-10-CM | POA: Diagnosis not present

## 2017-04-05 DIAGNOSIS — R002 Palpitations: Secondary | ICD-10-CM | POA: Diagnosis not present

## 2017-04-05 DIAGNOSIS — N3001 Acute cystitis with hematuria: Secondary | ICD-10-CM | POA: Diagnosis not present

## 2017-04-05 DIAGNOSIS — E039 Hypothyroidism, unspecified: Secondary | ICD-10-CM | POA: Diagnosis not present

## 2017-04-06 DIAGNOSIS — H18832 Recurrent erosion of cornea, left eye: Secondary | ICD-10-CM | POA: Diagnosis not present

## 2017-04-12 DIAGNOSIS — H18832 Recurrent erosion of cornea, left eye: Secondary | ICD-10-CM | POA: Diagnosis not present

## 2017-04-14 DIAGNOSIS — R350 Frequency of micturition: Secondary | ICD-10-CM | POA: Diagnosis not present

## 2017-04-19 DIAGNOSIS — R300 Dysuria: Secondary | ICD-10-CM | POA: Diagnosis not present

## 2017-05-03 DIAGNOSIS — H18832 Recurrent erosion of cornea, left eye: Secondary | ICD-10-CM | POA: Diagnosis not present

## 2017-05-24 DIAGNOSIS — B370 Candidal stomatitis: Secondary | ICD-10-CM | POA: Diagnosis not present

## 2017-05-24 DIAGNOSIS — M818 Other osteoporosis without current pathological fracture: Secondary | ICD-10-CM | POA: Diagnosis not present

## 2017-05-24 DIAGNOSIS — E039 Hypothyroidism, unspecified: Secondary | ICD-10-CM | POA: Diagnosis not present

## 2017-05-24 DIAGNOSIS — E236 Other disorders of pituitary gland: Secondary | ICD-10-CM | POA: Diagnosis not present

## 2017-05-24 DIAGNOSIS — E559 Vitamin D deficiency, unspecified: Secondary | ICD-10-CM | POA: Diagnosis not present

## 2017-05-25 DIAGNOSIS — H18832 Recurrent erosion of cornea, left eye: Secondary | ICD-10-CM | POA: Diagnosis not present

## 2017-06-01 DIAGNOSIS — R300 Dysuria: Secondary | ICD-10-CM | POA: Diagnosis not present

## 2017-06-16 DIAGNOSIS — H18832 Recurrent erosion of cornea, left eye: Secondary | ICD-10-CM | POA: Diagnosis not present

## 2017-07-12 DIAGNOSIS — H18832 Recurrent erosion of cornea, left eye: Secondary | ICD-10-CM | POA: Diagnosis not present

## 2017-08-06 ENCOUNTER — Emergency Department (HOSPITAL_BASED_OUTPATIENT_CLINIC_OR_DEPARTMENT_OTHER)
Admission: EM | Admit: 2017-08-06 | Discharge: 2017-08-06 | Disposition: A | Discharge status: Home / Self Care | Payer: Medicare Other | Attending: Emergency Medicine | Admitting: Emergency Medicine

## 2017-08-06 ENCOUNTER — Other Ambulatory Visit: Payer: Self-pay

## 2017-08-06 ENCOUNTER — Encounter (HOSPITAL_BASED_OUTPATIENT_CLINIC_OR_DEPARTMENT_OTHER): Payer: Self-pay | Admitting: *Deleted

## 2017-08-06 DIAGNOSIS — Z5321 Procedure and treatment not carried out due to patient leaving prior to being seen by health care provider: Secondary | ICD-10-CM | POA: Diagnosis not present

## 2017-08-06 DIAGNOSIS — R079 Chest pain, unspecified: Secondary | ICD-10-CM | POA: Insufficient documentation

## 2017-08-06 NOTE — ED Triage Notes (Signed)
States she is fine right now but this am she had a metalic tingling in the top of her mouth followed by pain in her mouth, chest and jaw. 10 minutes later the same thing happened. She texted her friend to let her know. Pt felt it was anxiety. She is talkative and in no distress at triage. EKG unremarkable.

## 2017-08-06 NOTE — ED Notes (Signed)
States she does not know if she can stay and see the doctor if the wait is long. She needs to be in and out to meet with her mothers doctors.

## 2017-09-01 DIAGNOSIS — H18832 Recurrent erosion of cornea, left eye: Secondary | ICD-10-CM | POA: Diagnosis not present

## 2017-09-22 DIAGNOSIS — E039 Hypothyroidism, unspecified: Secondary | ICD-10-CM | POA: Diagnosis not present

## 2017-09-22 DIAGNOSIS — E236 Other disorders of pituitary gland: Secondary | ICD-10-CM | POA: Diagnosis not present

## 2017-10-06 DIAGNOSIS — H18832 Recurrent erosion of cornea, left eye: Secondary | ICD-10-CM | POA: Diagnosis not present

## 2017-10-06 DIAGNOSIS — D3130 Benign neoplasm of unspecified choroid: Secondary | ICD-10-CM | POA: Diagnosis not present

## 2017-10-06 DIAGNOSIS — D3132 Benign neoplasm of left choroid: Secondary | ICD-10-CM | POA: Diagnosis not present

## 2017-10-19 DIAGNOSIS — R229 Localized swelling, mass and lump, unspecified: Secondary | ICD-10-CM | POA: Diagnosis not present

## 2017-10-19 DIAGNOSIS — N390 Urinary tract infection, site not specified: Secondary | ICD-10-CM | POA: Diagnosis not present

## 2017-10-19 DIAGNOSIS — N3000 Acute cystitis without hematuria: Secondary | ICD-10-CM | POA: Diagnosis not present

## 2017-10-21 IMAGING — CR DG CHEST 2V
2 series · 2 of 2 positions shown · non-contrast
Comparison: 03/24/2016

CLINICAL DATA: Intermittent chest pain.

EXAM:
CHEST  2 VIEW

[w chest pa]
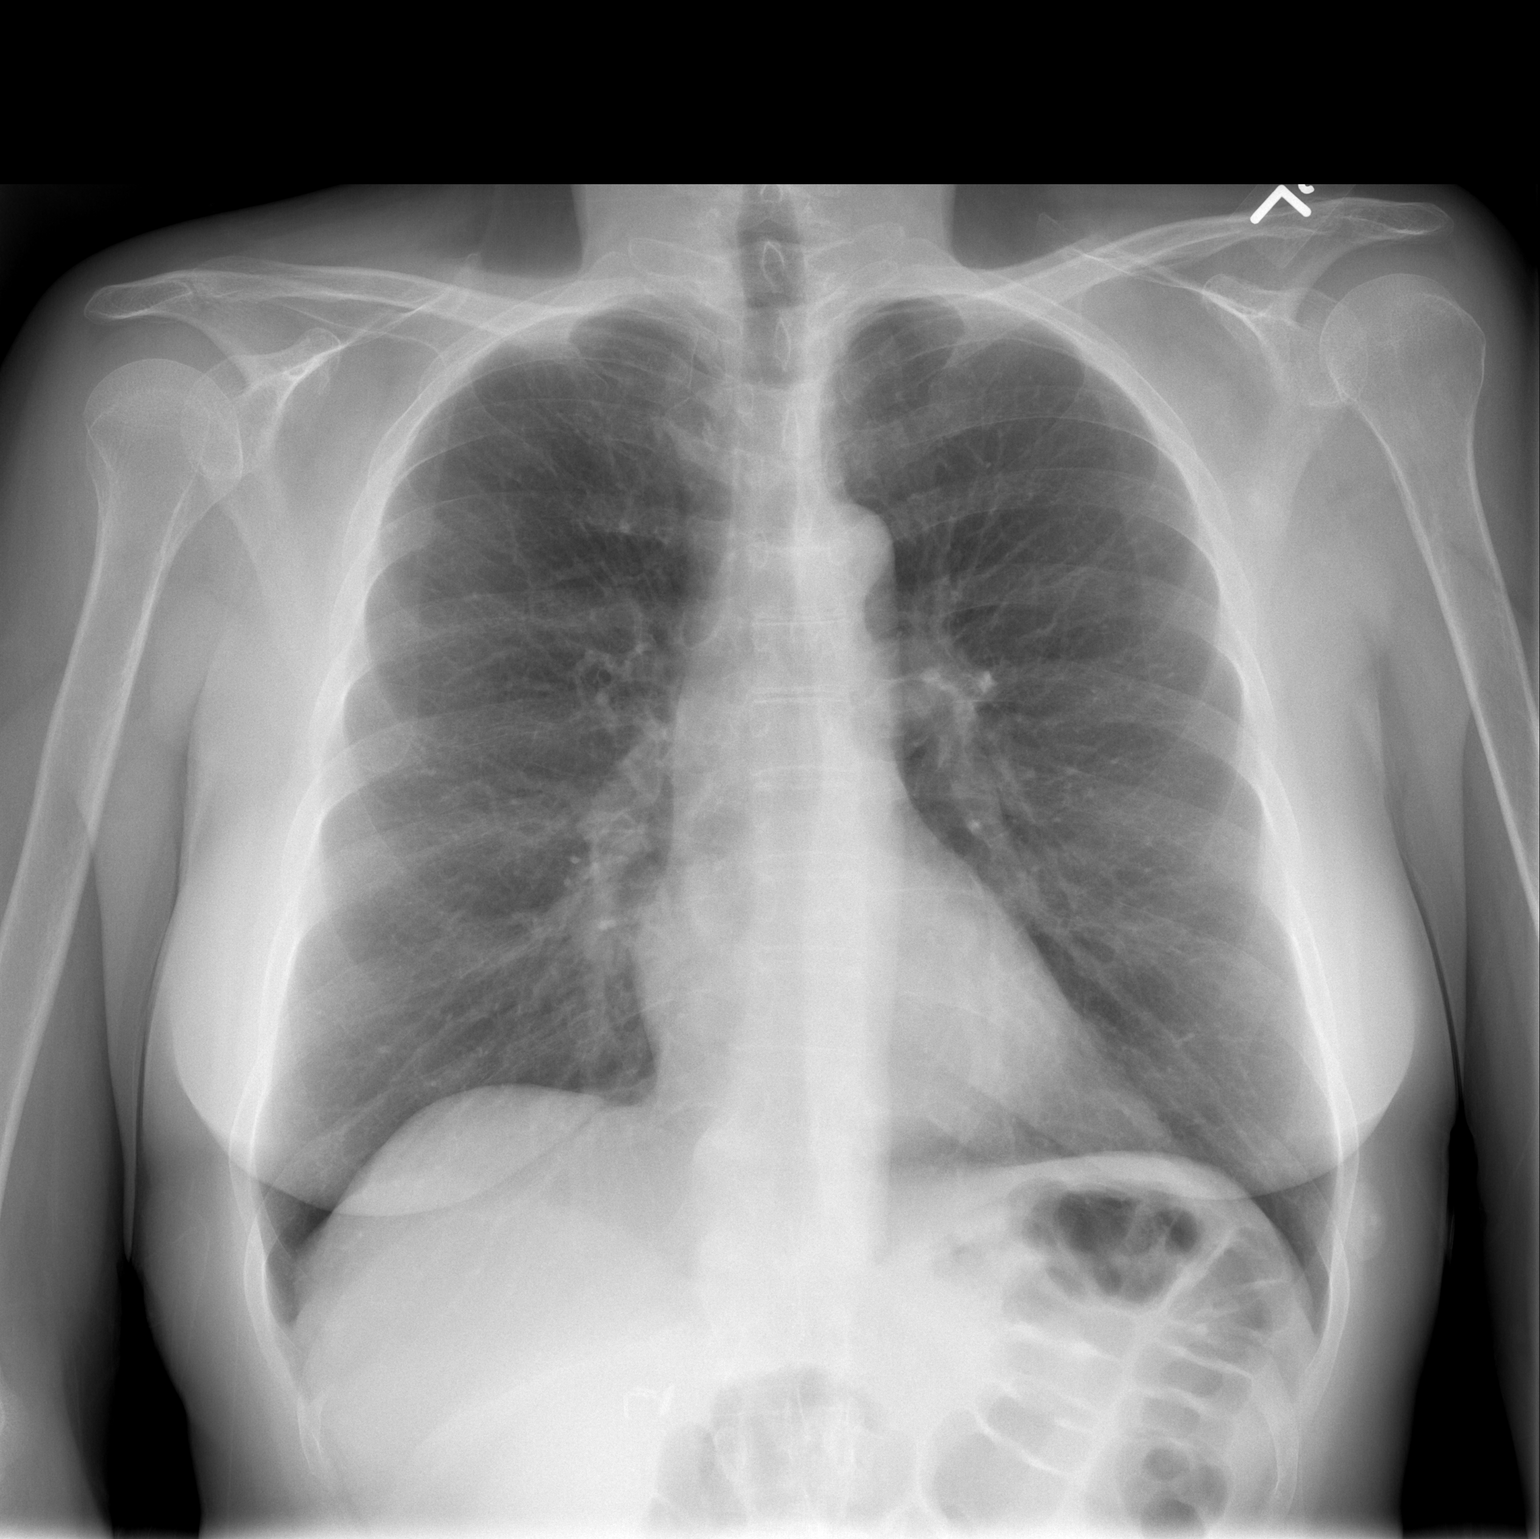

[w chest lat]
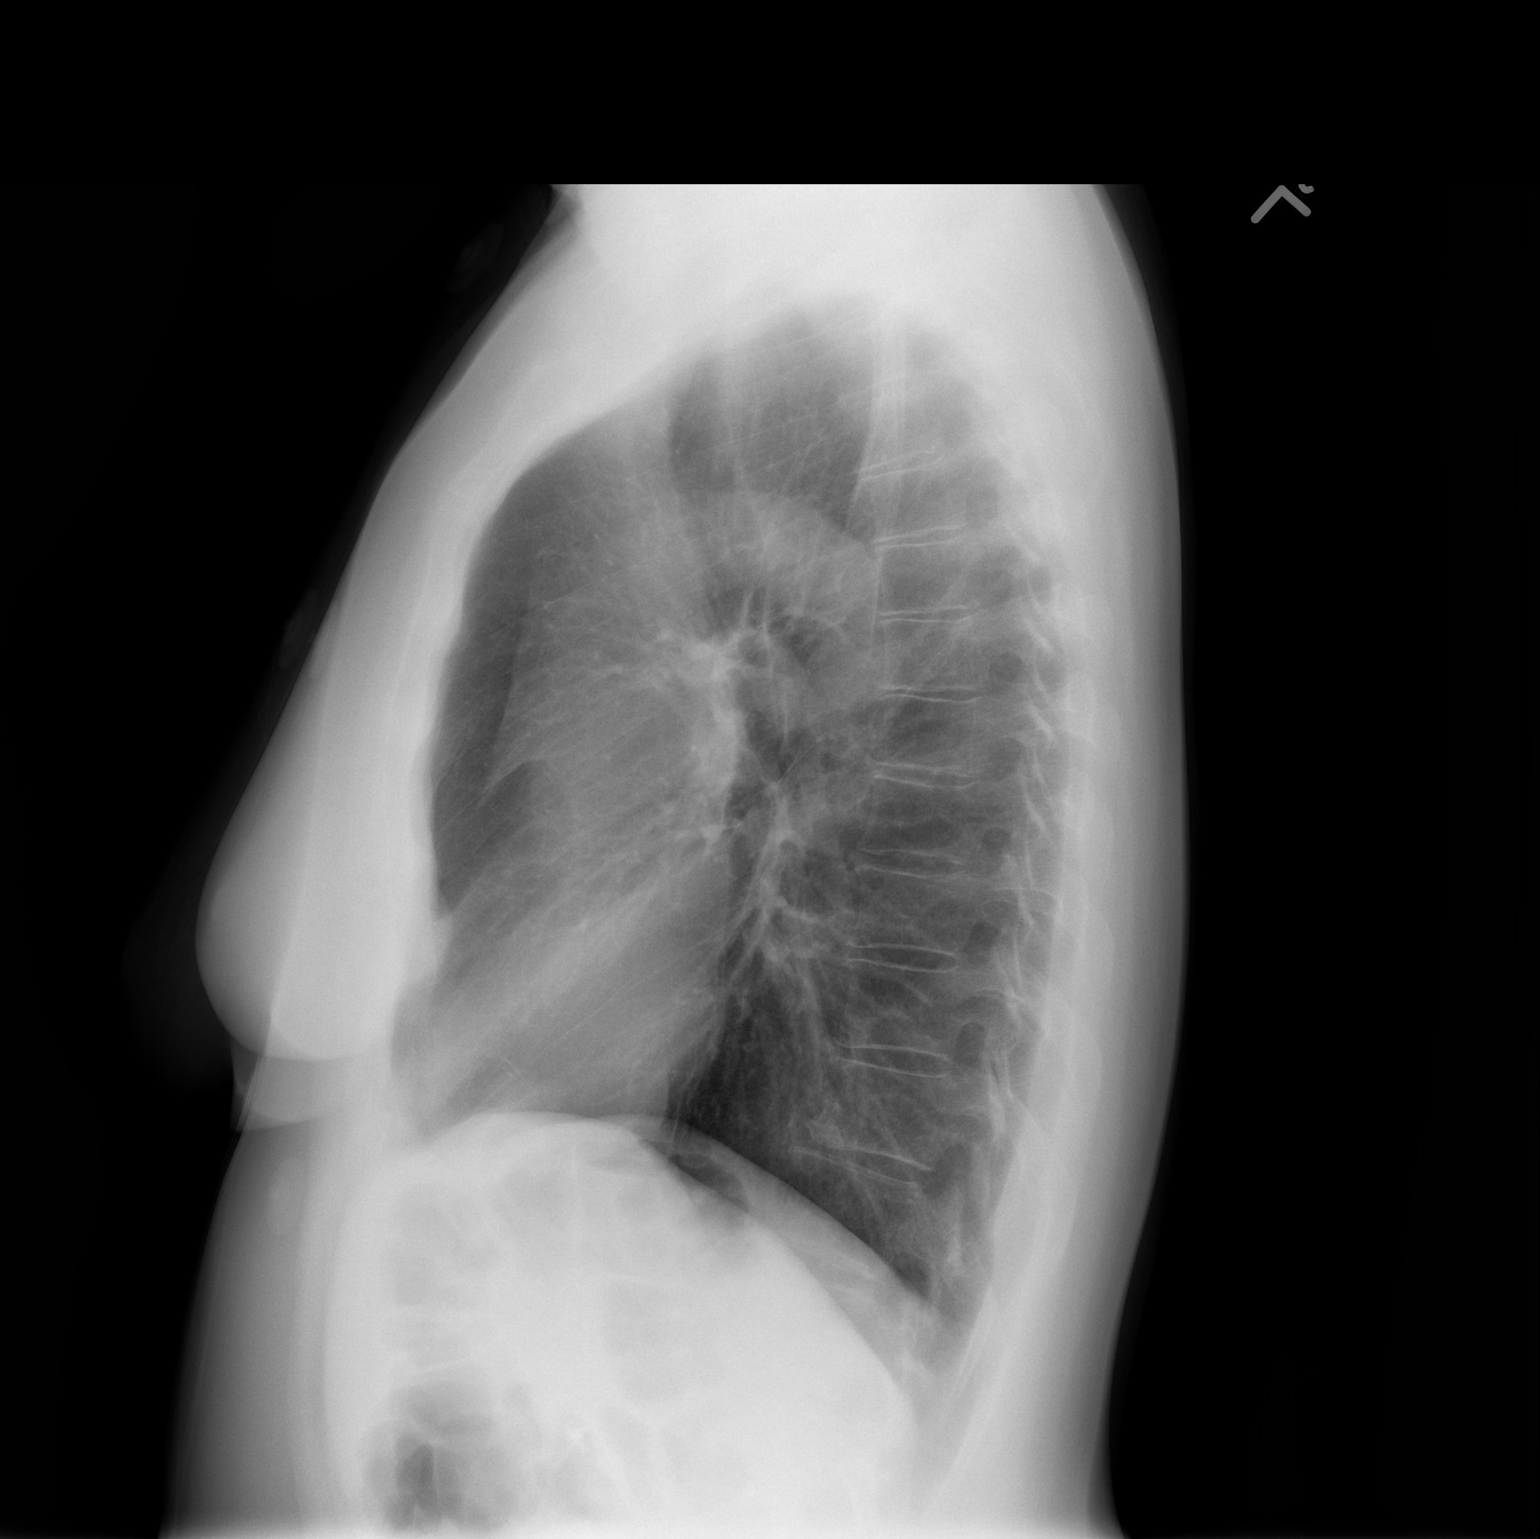

[2 of 2 positions shown; findings below may reference images not displayed]

FINDINGS: The heart size and mediastinal contours are within normal limits.
Both lungs are clear. The visualized skeletal structures are
unremarkable.
IMPRESSION: No active cardiopulmonary disease.

## 2017-11-09 DIAGNOSIS — J410 Simple chronic bronchitis: Secondary | ICD-10-CM | POA: Diagnosis not present

## 2017-11-09 DIAGNOSIS — Z8701 Personal history of pneumonia (recurrent): Secondary | ICD-10-CM | POA: Diagnosis not present

## 2017-11-09 DIAGNOSIS — J441 Chronic obstructive pulmonary disease with (acute) exacerbation: Secondary | ICD-10-CM | POA: Diagnosis not present

## 2017-11-09 DIAGNOSIS — R05 Cough: Secondary | ICD-10-CM | POA: Diagnosis not present

## 2017-11-09 DIAGNOSIS — R918 Other nonspecific abnormal finding of lung field: Secondary | ICD-10-CM | POA: Diagnosis not present

## 2017-11-12 DIAGNOSIS — J441 Chronic obstructive pulmonary disease with (acute) exacerbation: Secondary | ICD-10-CM | POA: Diagnosis not present

## 2017-11-19 DIAGNOSIS — H18832 Recurrent erosion of cornea, left eye: Secondary | ICD-10-CM | POA: Diagnosis not present

## 2017-11-29 ENCOUNTER — Ambulatory Visit: Payer: Medicare Other | Admitting: Family Medicine

## 2017-12-13 DIAGNOSIS — H2513 Age-related nuclear cataract, bilateral: Secondary | ICD-10-CM | POA: Diagnosis not present

## 2017-12-13 DIAGNOSIS — H18832 Recurrent erosion of cornea, left eye: Secondary | ICD-10-CM | POA: Diagnosis not present

## 2017-12-14 ENCOUNTER — Ambulatory Visit (INDEPENDENT_AMBULATORY_CARE_PROVIDER_SITE_OTHER): Payer: Medicare Other | Admitting: Family Medicine

## 2017-12-14 ENCOUNTER — Encounter: Payer: Self-pay | Admitting: Family Medicine

## 2017-12-14 DIAGNOSIS — M79672 Pain in left foot: Secondary | ICD-10-CM

## 2017-12-14 DIAGNOSIS — M25562 Pain in left knee: Secondary | ICD-10-CM

## 2017-12-14 DIAGNOSIS — M25551 Pain in right hip: Secondary | ICD-10-CM

## 2017-12-14 MED ORDER — CYCLOBENZAPRINE HCL 10 MG PO TABS: 10.0000 mg | ORAL_TABLET | Freq: Three times a day (TID) | ORAL | 1 refills | Status: DC | PRN

## 2017-12-14 NOTE — Patient Instructions (Signed)
We will send you to physical therapy for patellar tendinitis, patellofemoral syndrome of your left knee.  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 Medications, exercises are unlikely to help with this unfortunately. The pain is expected to go completely away - the mass may still be there to some degree but this is more unpredictable.  I wouldn't expect it to get larger. Follow up with me in 6 weeks or as needed.

## 2017-12-14 NOTE — Progress Notes (Signed)
Subjective:    Patient ID: Melanie Hartman is a 57 y.o. female.  Chief Complaint: Left knee/foot pain  HPI: Patient presents with left knee and left foot pain. She has a history of patellofemoral syndrome and left patellar fracture that occurred in September 2017 but says the PT she completed last year helped. She is able to swim long distances but says that this does not translate to land and still has some weakness, swelling and mild pain with extended, non-aquatic exercise. Describes the pain as a tight band around her knee cap. She says that she has a significant amount of swelling at the end of the day when she is active, including a "racquetball" sized amount in the popliteal fossa occasionally. She denies any new or recent injury to the knee. Pain is 0/10 today and she is not using any ice or analgesics. She would like to complete another round of PT to aid in strengthening.   She is also complaining of a tender nodule on her left foot around the extensor hallucis tendon. On May 9th, she had been hiking for about one hour in new shoes when she felt a severe pain in that area. She says she took her new shoes off and put on tennis shoes but it did not help. When she stopped to examine the area she noticed a slightly red nodule that was extremely tender to touch, so much so that even a small breeze would cause pain. She had pain with with walking as well but says it was not as severe as when something touched the area. She states she tried Tramadol and ibuprofen for the pain which helped and it slowly got better and resolved after 2 weeks. She tried using ice but couldn't tolerate it. She denies any injury to the area or pain, redness, or swelling to the first MTP joint. Pain is a 0/10 today. She says she occasionally experiences a sharp pain in her left calf and lateral leg numbness as well.   Past Medical History:  Diagnosis Date  . Abdominal tenderness   . Anorexia nervosa   . Anxiety state,  unspecified   . Attention deficit disorder with hyperactivity(314.01)   . Benign neoplasm of choroid    Right  . Borderline personality disorder ([place])   . Carbuncle and furuncle of trunk   . Contact dermatitis and other eczema, due to unspecified cause   . Contusion of knee   . Esophageal reflux   . Mastodynia   . Osteoporosis, unspecified   . Painful respiration   . Swelling, mass, or lump in chest   . Thyroid disease   . 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 Transient disorder of initiating or maintaining sleep   . Unspecified hypothyroidism   . Vasovagal syndrome     Current Outpatient Medications on File Prior to Visit  Medication Sig Dispense Refill  . diclofenac (VOLTAREN) 75 MG EC tablet Take 1 tablet (75 mg total) by mouth 2 (two) times daily. 60 tablet 1  . diclofenac sodium (VOLTAREN) 1 % GEL Apply 4 g topically 4 (four) times daily. 5 Tube 2  . famciclovir (FAMVIR) 500 MG tablet Take 500 mg by mouth 2 (two) times daily.    . famotidine (PEPCID) 20 MG tablet Take 20 mg by mouth.    . fluticasone (FLONASE) 50 MCG/ACT nasal spray 2 sprays by Each Nare route daily as needed.     . ibandronate (BONIVA) 150  MG tablet Take 150 mg by mouth.    . levothyroxine (SYNTHROID, LEVOTHROID) 100 MCG tablet TAKE ONE TABLET BY MOUTH ONCE DAILY AS DIRECTED    . midodrine (PROAMATINE) 5 MG tablet TAKE ONE TABLET BY MOUTH THREE TIMES DAILY    . traMADol (ULTRAM) 50 MG tablet Take 1 tablet (50 mg total) by mouth every 6 (six) hours as needed. 60 tablet 0   No current facility-administered medications on file prior to visit.     Past Surgical History:  Procedure Laterality Date  . CHOLECYSTECTOMY    . THYROID SURGERY    . TONSILLECTOMY    . TONSILLECTOMY      Allergies  Allergen Reactions  . Cephalexin Anaphylaxis  . Doxycycline Rash and Shortness Of Breath  . Erythromycin Base Other (See Comments) and Shortness Of Breath    GI Upset  .  Azithromycin Other (See Comments)  . Bee Venom Other (See Comments)  . Clarithromycin   . Codeine   . Darvon   . Metronidazole Other (See Comments)    Mouth sores  . Nsaids Other (See Comments)    GI Upset  . Penicillins   . Poison Oak Extract Other (See Comments)  . Sulfa Antibiotics   . Vitamin E Other (See Comments)  . Ivp Dye [Iodinated Diagnostic Agents] Nausea And Vomiting, Rash and Other (See Comments)    Dyspnea  . Prednisone Nausea And Vomiting and Rash    Social History   Socioeconomic History  . Marital status: Single    Spouse name: Not on file  . Number of children: Not on file  . Years of education: Not on file  . Highest education level: Not on file  Occupational History  . Not on file  Social Needs  . Financial resource strain: Not on file  . Food insecurity:    Worry: Not on file    Inability: Not on file  . Transportation needs:    Medical: Not on file    Non-medical: Not on file  Tobacco Use  . Smoking status: Never Smoker  . Smokeless tobacco: Never Used  Substance and Sexual Activity  . Alcohol use: No    Alcohol/week: 0.0 oz  . Drug use: No  . Sexual activity: Not on file  Lifestyle  . Physical activity:    Days per week: Not on file    Minutes per session: Not on file  . Stress: Not on file  Relationships  . Social connections:    Talks on phone: Not on file    Gets together: Not on file    Attends religious service: Not on file    Active member of club or organization: Not on file    Attends meetings of clubs or organizations: Not on file    Relationship status: Not on file  . Intimate partner violence:    Fear of current or ex partner: Not on file    Emotionally abused: Not on file    Physically abused: Not on file    Forced sexual activity: Not on file  Other Topics Concern  . Not on file  Social History Narrative  . Not on file    No family history on file.  BP 115/75   Pulse 71   Ht 5\' 1"  (1.549 m)   Wt 130 lb (59  kg)   BMI 24.56 kg/m    ROS: Constitutional: Negative for fever, chills, diaphoresis.  Skin: Negative for bruising, ecchymosis. MSK: see HPI above.  Neuro: Positive for numbness. Negative for tingling.   Objective:  Physical Exam: Gen: NAD, comfortable in exam room  Left knee/leg: No gross deformity, ecchymoses, swelling, palpable cords. No TTP. FROM with 5/5 strength flexion/extension. Negative ant/post drawers. Negative valgus/varus testing. Negative lachmanns. Negative mcmurrays, apleys, patellar apprehension. NV intact distally.   Left foot/ankle: Hallux valgus. No swelling, ecchymoses. FROM of ankle with 5/5 strength all directions. Mild TTP of slightly raised nodule over extensor hallucis longus tendon, just proximal to 1st MTP joint. FROM without pain of 1st MTP joint. Negative ant drawer and talar tilt. Negative syndesmotic compression. Thompsons test negative. NV intact distally.  MSK u/s: No evidence of fluid within extensor hallucis longus tendon. Small, subcutaneous tissue mass consistent with a fibroma adjacent to extensor hallucis longus tendon but without vascularity; mass is homogeneous. Very small Baker's cyst in left popliteal fossa. Veins easily compressible, no evidence of thrombosis.    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  Very small bakers cyst seen today.  She has a small fibroma on her left foot over the extensor hallucis tendon and just proximal to the 1st MTP joint. No concerning features of gout or septic joint. She was educated on fibromas and reassured based on the appearance on ultrasound. All questions and concerns were answered to her satisfaction. She will follow-up in six weeks or as needed for persistent symptoms.

## 2017-12-15 ENCOUNTER — Encounter: Payer: Self-pay | Admitting: Family Medicine

## 2017-12-15 DIAGNOSIS — M79672 Pain in left foot: Secondary | ICD-10-CM | POA: Insufficient documentation

## 2017-12-15 NOTE — Assessment & Plan Note (Signed)
Patient's knee complaints are consistent with patellofemoral syndrome and patellar tendinitis for which we will send her back to PT.  Very small bakers cyst seen today.

## 2017-12-15 NOTE — Assessment & Plan Note (Signed)
  She has a small fibroma on her left foot over the extensor hallucis tendon and just proximal to the 1st MTP joint. No concerning features of gout or septic joint. She was educated on fibromas and reassured based on the appearance on ultrasound. All questions and concerns were answered to her satisfaction. She will follow-up in six weeks or as needed for persistent symptoms.

## 2018-01-03 DIAGNOSIS — R002 Palpitations: Secondary | ICD-10-CM | POA: Diagnosis not present

## 2018-01-03 DIAGNOSIS — R0609 Other forms of dyspnea: Secondary | ICD-10-CM | POA: Diagnosis not present

## 2018-01-03 DIAGNOSIS — I700 Atherosclerosis of aorta: Secondary | ICD-10-CM | POA: Diagnosis not present

## 2018-01-03 DIAGNOSIS — Z8249 Family history of ischemic heart disease and other diseases of the circulatory system: Secondary | ICD-10-CM | POA: Diagnosis not present

## 2018-01-03 DIAGNOSIS — E039 Hypothyroidism, unspecified: Secondary | ICD-10-CM | POA: Diagnosis not present

## 2018-01-03 DIAGNOSIS — R5383 Other fatigue: Secondary | ICD-10-CM | POA: Diagnosis not present

## 2018-01-03 DIAGNOSIS — I951 Orthostatic hypotension: Secondary | ICD-10-CM | POA: Diagnosis not present

## 2018-01-07 DIAGNOSIS — R29898 Other symptoms and signs involving the musculoskeletal system: Secondary | ICD-10-CM | POA: Diagnosis not present

## 2018-01-07 DIAGNOSIS — R2689 Other abnormalities of gait and mobility: Secondary | ICD-10-CM | POA: Diagnosis not present

## 2018-01-07 DIAGNOSIS — M25562 Pain in left knee: Secondary | ICD-10-CM | POA: Diagnosis not present

## 2018-01-10 DIAGNOSIS — H18832 Recurrent erosion of cornea, left eye: Secondary | ICD-10-CM | POA: Diagnosis not present

## 2018-01-12 DIAGNOSIS — R29898 Other symptoms and signs involving the musculoskeletal system: Secondary | ICD-10-CM | POA: Diagnosis not present

## 2018-01-12 DIAGNOSIS — M25562 Pain in left knee: Secondary | ICD-10-CM | POA: Diagnosis not present

## 2018-01-12 DIAGNOSIS — R2689 Other abnormalities of gait and mobility: Secondary | ICD-10-CM | POA: Diagnosis not present

## 2018-01-20 DIAGNOSIS — J069 Acute upper respiratory infection, unspecified: Secondary | ICD-10-CM | POA: Diagnosis not present

## 2018-01-25 ENCOUNTER — Ambulatory Visit: Payer: Medicare Other | Admitting: Family Medicine

## 2018-01-25 DIAGNOSIS — R2689 Other abnormalities of gait and mobility: Secondary | ICD-10-CM | POA: Diagnosis not present

## 2018-01-25 DIAGNOSIS — M25562 Pain in left knee: Secondary | ICD-10-CM | POA: Diagnosis not present

## 2018-01-25 DIAGNOSIS — R29898 Other symptoms and signs involving the musculoskeletal system: Secondary | ICD-10-CM | POA: Diagnosis not present

## 2018-01-28 DIAGNOSIS — R29898 Other symptoms and signs involving the musculoskeletal system: Secondary | ICD-10-CM | POA: Diagnosis not present

## 2018-01-28 DIAGNOSIS — M25562 Pain in left knee: Secondary | ICD-10-CM | POA: Diagnosis not present

## 2018-01-28 DIAGNOSIS — R2689 Other abnormalities of gait and mobility: Secondary | ICD-10-CM | POA: Diagnosis not present

## 2018-01-28 DIAGNOSIS — M25561 Pain in right knee: Secondary | ICD-10-CM | POA: Diagnosis not present

## 2018-01-31 DIAGNOSIS — I700 Atherosclerosis of aorta: Secondary | ICD-10-CM | POA: Diagnosis not present

## 2018-01-31 DIAGNOSIS — R0609 Other forms of dyspnea: Secondary | ICD-10-CM | POA: Diagnosis not present

## 2018-01-31 DIAGNOSIS — Z8249 Family history of ischemic heart disease and other diseases of the circulatory system: Secondary | ICD-10-CM | POA: Diagnosis not present

## 2018-01-31 DIAGNOSIS — R5383 Other fatigue: Secondary | ICD-10-CM | POA: Diagnosis not present

## 2018-02-01 DIAGNOSIS — R2689 Other abnormalities of gait and mobility: Secondary | ICD-10-CM | POA: Diagnosis not present

## 2018-02-01 DIAGNOSIS — R29898 Other symptoms and signs involving the musculoskeletal system: Secondary | ICD-10-CM | POA: Diagnosis not present

## 2018-02-01 DIAGNOSIS — M25561 Pain in right knee: Secondary | ICD-10-CM | POA: Diagnosis not present

## 2018-02-01 DIAGNOSIS — M25562 Pain in left knee: Secondary | ICD-10-CM | POA: Diagnosis not present

## 2018-02-04 DIAGNOSIS — L821 Other seborrheic keratosis: Secondary | ICD-10-CM | POA: Diagnosis not present

## 2018-02-04 DIAGNOSIS — D485 Neoplasm of uncertain behavior of skin: Secondary | ICD-10-CM | POA: Diagnosis not present

## 2018-02-04 DIAGNOSIS — D2372 Other benign neoplasm of skin of left lower limb, including hip: Secondary | ICD-10-CM | POA: Diagnosis not present

## 2018-02-05 ENCOUNTER — Other Ambulatory Visit: Payer: Self-pay | Admitting: Family Medicine

## 2018-02-09 DIAGNOSIS — Z13220 Encounter for screening for lipoid disorders: Secondary | ICD-10-CM | POA: Diagnosis not present

## 2018-02-09 DIAGNOSIS — E039 Hypothyroidism, unspecified: Secondary | ICD-10-CM | POA: Diagnosis not present

## 2018-02-09 DIAGNOSIS — E559 Vitamin D deficiency, unspecified: Secondary | ICD-10-CM | POA: Diagnosis not present

## 2018-02-09 DIAGNOSIS — I700 Atherosclerosis of aorta: Secondary | ICD-10-CM | POA: Diagnosis not present

## 2018-02-09 DIAGNOSIS — R5383 Other fatigue: Secondary | ICD-10-CM | POA: Diagnosis not present

## 2018-02-09 DIAGNOSIS — Z8659 Personal history of other mental and behavioral disorders: Secondary | ICD-10-CM | POA: Diagnosis not present

## 2018-02-09 DIAGNOSIS — R635 Abnormal weight gain: Secondary | ICD-10-CM | POA: Diagnosis not present

## 2018-02-09 DIAGNOSIS — F419 Anxiety disorder, unspecified: Secondary | ICD-10-CM | POA: Diagnosis not present

## 2018-02-09 DIAGNOSIS — Z789 Other specified health status: Secondary | ICD-10-CM | POA: Diagnosis not present

## 2018-02-09 DIAGNOSIS — J3489 Other specified disorders of nose and nasal sinuses: Secondary | ICD-10-CM | POA: Diagnosis not present

## 2018-02-11 DIAGNOSIS — F419 Anxiety disorder, unspecified: Secondary | ICD-10-CM | POA: Diagnosis not present

## 2018-02-11 DIAGNOSIS — R5383 Other fatigue: Secondary | ICD-10-CM | POA: Diagnosis not present

## 2018-02-11 DIAGNOSIS — Z13220 Encounter for screening for lipoid disorders: Secondary | ICD-10-CM | POA: Diagnosis not present

## 2018-02-11 DIAGNOSIS — Z789 Other specified health status: Secondary | ICD-10-CM | POA: Diagnosis not present

## 2018-02-11 DIAGNOSIS — E559 Vitamin D deficiency, unspecified: Secondary | ICD-10-CM | POA: Diagnosis not present

## 2018-02-11 DIAGNOSIS — E039 Hypothyroidism, unspecified: Secondary | ICD-10-CM | POA: Diagnosis not present

## 2018-02-11 DIAGNOSIS — Z79899 Other long term (current) drug therapy: Secondary | ICD-10-CM | POA: Diagnosis not present

## 2018-02-11 DIAGNOSIS — I700 Atherosclerosis of aorta: Secondary | ICD-10-CM | POA: Diagnosis not present

## 2018-02-12 DIAGNOSIS — N3000 Acute cystitis without hematuria: Secondary | ICD-10-CM | POA: Diagnosis not present

## 2018-02-12 DIAGNOSIS — R300 Dysuria: Secondary | ICD-10-CM | POA: Diagnosis not present

## 2018-02-14 DIAGNOSIS — R29898 Other symptoms and signs involving the musculoskeletal system: Secondary | ICD-10-CM | POA: Diagnosis not present

## 2018-02-14 DIAGNOSIS — R2689 Other abnormalities of gait and mobility: Secondary | ICD-10-CM | POA: Diagnosis not present

## 2018-02-14 DIAGNOSIS — M25561 Pain in right knee: Secondary | ICD-10-CM | POA: Diagnosis not present

## 2018-02-14 DIAGNOSIS — M25562 Pain in left knee: Secondary | ICD-10-CM | POA: Diagnosis not present

## 2018-02-15 DIAGNOSIS — F4310 Post-traumatic stress disorder, unspecified: Secondary | ICD-10-CM | POA: Diagnosis not present

## 2018-02-17 DIAGNOSIS — D485 Neoplasm of uncertain behavior of skin: Secondary | ICD-10-CM | POA: Diagnosis not present

## 2018-02-17 DIAGNOSIS — C4441 Basal cell carcinoma of skin of scalp and neck: Secondary | ICD-10-CM | POA: Diagnosis not present

## 2018-02-21 DIAGNOSIS — F4310 Post-traumatic stress disorder, unspecified: Secondary | ICD-10-CM | POA: Diagnosis not present

## 2018-02-22 DIAGNOSIS — R2689 Other abnormalities of gait and mobility: Secondary | ICD-10-CM | POA: Diagnosis not present

## 2018-02-22 DIAGNOSIS — M25562 Pain in left knee: Secondary | ICD-10-CM | POA: Diagnosis not present

## 2018-02-22 DIAGNOSIS — R29898 Other symptoms and signs involving the musculoskeletal system: Secondary | ICD-10-CM | POA: Diagnosis not present

## 2018-02-22 DIAGNOSIS — M25561 Pain in right knee: Secondary | ICD-10-CM | POA: Diagnosis not present

## 2018-03-01 DIAGNOSIS — M25562 Pain in left knee: Secondary | ICD-10-CM | POA: Diagnosis not present

## 2018-03-01 DIAGNOSIS — F4310 Post-traumatic stress disorder, unspecified: Secondary | ICD-10-CM | POA: Diagnosis not present

## 2018-03-01 DIAGNOSIS — G8929 Other chronic pain: Secondary | ICD-10-CM | POA: Diagnosis not present

## 2018-03-01 DIAGNOSIS — Z7409 Other reduced mobility: Secondary | ICD-10-CM | POA: Diagnosis not present

## 2018-03-01 DIAGNOSIS — M25561 Pain in right knee: Secondary | ICD-10-CM | POA: Diagnosis not present

## 2018-03-02 DIAGNOSIS — G8929 Other chronic pain: Secondary | ICD-10-CM | POA: Diagnosis not present

## 2018-03-02 DIAGNOSIS — M25561 Pain in right knee: Secondary | ICD-10-CM | POA: Diagnosis not present

## 2018-03-02 DIAGNOSIS — M25562 Pain in left knee: Secondary | ICD-10-CM | POA: Diagnosis not present

## 2018-03-02 DIAGNOSIS — Z7409 Other reduced mobility: Secondary | ICD-10-CM | POA: Diagnosis not present

## 2018-03-15 DIAGNOSIS — Z7409 Other reduced mobility: Secondary | ICD-10-CM | POA: Diagnosis not present

## 2018-03-15 DIAGNOSIS — M25562 Pain in left knee: Secondary | ICD-10-CM | POA: Diagnosis not present

## 2018-03-15 DIAGNOSIS — M25561 Pain in right knee: Secondary | ICD-10-CM | POA: Diagnosis not present

## 2018-03-15 DIAGNOSIS — G8929 Other chronic pain: Secondary | ICD-10-CM | POA: Diagnosis not present

## 2018-03-24 DIAGNOSIS — G8929 Other chronic pain: Secondary | ICD-10-CM | POA: Diagnosis not present

## 2018-03-24 DIAGNOSIS — M25562 Pain in left knee: Secondary | ICD-10-CM | POA: Diagnosis not present

## 2018-03-24 DIAGNOSIS — M25561 Pain in right knee: Secondary | ICD-10-CM | POA: Diagnosis not present

## 2018-03-24 DIAGNOSIS — Z7409 Other reduced mobility: Secondary | ICD-10-CM | POA: Diagnosis not present

## 2018-03-25 DIAGNOSIS — E039 Hypothyroidism, unspecified: Secondary | ICD-10-CM | POA: Diagnosis not present

## 2018-03-25 DIAGNOSIS — M150 Primary generalized (osteo)arthritis: Secondary | ICD-10-CM | POA: Diagnosis not present

## 2018-03-25 DIAGNOSIS — M25552 Pain in left hip: Secondary | ICD-10-CM | POA: Diagnosis not present

## 2018-03-25 DIAGNOSIS — M818 Other osteoporosis without current pathological fracture: Secondary | ICD-10-CM | POA: Diagnosis not present

## 2018-03-25 DIAGNOSIS — Z8659 Personal history of other mental and behavioral disorders: Secondary | ICD-10-CM | POA: Diagnosis not present

## 2018-03-25 DIAGNOSIS — C4441 Basal cell carcinoma of skin of scalp and neck: Secondary | ICD-10-CM | POA: Diagnosis not present

## 2018-03-25 DIAGNOSIS — E559 Vitamin D deficiency, unspecified: Secondary | ICD-10-CM | POA: Diagnosis not present

## 2018-03-29 DIAGNOSIS — I471 Supraventricular tachycardia: Secondary | ICD-10-CM | POA: Diagnosis not present

## 2018-03-29 DIAGNOSIS — M150 Primary generalized (osteo)arthritis: Secondary | ICD-10-CM | POA: Diagnosis not present

## 2018-03-29 DIAGNOSIS — D72810 Lymphocytopenia: Secondary | ICD-10-CM | POA: Diagnosis not present

## 2018-03-29 DIAGNOSIS — N3001 Acute cystitis with hematuria: Secondary | ICD-10-CM | POA: Diagnosis not present

## 2018-03-29 DIAGNOSIS — E039 Hypothyroidism, unspecified: Secondary | ICD-10-CM | POA: Diagnosis not present

## 2018-03-29 DIAGNOSIS — F4310 Post-traumatic stress disorder, unspecified: Secondary | ICD-10-CM | POA: Diagnosis not present

## 2018-04-05 DIAGNOSIS — G8929 Other chronic pain: Secondary | ICD-10-CM | POA: Diagnosis not present

## 2018-04-05 DIAGNOSIS — M25562 Pain in left knee: Secondary | ICD-10-CM | POA: Diagnosis not present

## 2018-04-05 DIAGNOSIS — Z7409 Other reduced mobility: Secondary | ICD-10-CM | POA: Diagnosis not present

## 2018-04-08 DIAGNOSIS — Z7409 Other reduced mobility: Secondary | ICD-10-CM | POA: Diagnosis not present

## 2018-04-08 DIAGNOSIS — M25562 Pain in left knee: Secondary | ICD-10-CM | POA: Diagnosis not present

## 2018-04-08 DIAGNOSIS — G8929 Other chronic pain: Secondary | ICD-10-CM | POA: Diagnosis not present

## 2018-04-13 DIAGNOSIS — Z7409 Other reduced mobility: Secondary | ICD-10-CM | POA: Diagnosis not present

## 2018-04-13 DIAGNOSIS — M25562 Pain in left knee: Secondary | ICD-10-CM | POA: Diagnosis not present

## 2018-04-13 DIAGNOSIS — G8929 Other chronic pain: Secondary | ICD-10-CM | POA: Diagnosis not present

## 2018-04-15 DIAGNOSIS — Z7409 Other reduced mobility: Secondary | ICD-10-CM | POA: Diagnosis not present

## 2018-04-15 DIAGNOSIS — G8929 Other chronic pain: Secondary | ICD-10-CM | POA: Diagnosis not present

## 2018-04-15 DIAGNOSIS — M25562 Pain in left knee: Secondary | ICD-10-CM | POA: Diagnosis not present

## 2018-04-24 DIAGNOSIS — N3001 Acute cystitis with hematuria: Secondary | ICD-10-CM | POA: Diagnosis not present

## 2018-04-25 ENCOUNTER — Telehealth: Payer: Self-pay | Admitting: Family Medicine

## 2018-04-25 NOTE — Telephone Encounter (Signed)
It's been over 4 months since we've seen her so she needs to follow up.  She's done over 15 visits of PT which is a lot though and if she's still having problems we need to talk about utility of doing this long term.

## 2018-04-25 NOTE — Telephone Encounter (Signed)
Patient is requesting more approved visits for physical therapy regarding her left knee. States she has been unable to attend regularly due to other medical issues

## 2018-04-27 DIAGNOSIS — M25562 Pain in left knee: Secondary | ICD-10-CM | POA: Diagnosis not present

## 2018-04-27 DIAGNOSIS — G8929 Other chronic pain: Secondary | ICD-10-CM | POA: Diagnosis not present

## 2018-04-27 DIAGNOSIS — Z7409 Other reduced mobility: Secondary | ICD-10-CM | POA: Diagnosis not present

## 2018-04-27 NOTE — Telephone Encounter (Signed)
Patient informed and a follow up appointment has been scheduled for 10/31

## 2018-04-28 ENCOUNTER — Ambulatory Visit: Payer: Medicare Other | Admitting: Family Medicine

## 2018-05-02 ENCOUNTER — Ambulatory Visit: Payer: Medicare Other | Admitting: Family Medicine

## 2018-05-02 DIAGNOSIS — F4310 Post-traumatic stress disorder, unspecified: Secondary | ICD-10-CM | POA: Diagnosis not present

## 2018-05-17 DIAGNOSIS — Z780 Asymptomatic menopausal state: Secondary | ICD-10-CM | POA: Diagnosis not present

## 2018-05-17 DIAGNOSIS — M818 Other osteoporosis without current pathological fracture: Secondary | ICD-10-CM | POA: Diagnosis not present

## 2018-05-17 DIAGNOSIS — M810 Age-related osteoporosis without current pathological fracture: Secondary | ICD-10-CM | POA: Diagnosis not present

## 2018-05-17 DIAGNOSIS — Z1231 Encounter for screening mammogram for malignant neoplasm of breast: Secondary | ICD-10-CM | POA: Diagnosis not present

## 2018-05-18 DIAGNOSIS — F4310 Post-traumatic stress disorder, unspecified: Secondary | ICD-10-CM | POA: Diagnosis not present

## 2018-05-31 DIAGNOSIS — E039 Hypothyroidism, unspecified: Secondary | ICD-10-CM | POA: Diagnosis not present

## 2018-05-31 DIAGNOSIS — M818 Other osteoporosis without current pathological fracture: Secondary | ICD-10-CM | POA: Diagnosis not present

## 2018-05-31 DIAGNOSIS — E559 Vitamin D deficiency, unspecified: Secondary | ICD-10-CM | POA: Diagnosis not present

## 2018-06-02 DIAGNOSIS — H18832 Recurrent erosion of cornea, left eye: Secondary | ICD-10-CM | POA: Diagnosis not present

## 2018-06-02 DIAGNOSIS — H2513 Age-related nuclear cataract, bilateral: Secondary | ICD-10-CM | POA: Diagnosis not present

## 2018-06-07 DIAGNOSIS — F4310 Post-traumatic stress disorder, unspecified: Secondary | ICD-10-CM | POA: Diagnosis not present

## 2018-06-13 DIAGNOSIS — N952 Postmenopausal atrophic vaginitis: Secondary | ICD-10-CM | POA: Diagnosis not present

## 2018-06-13 DIAGNOSIS — N390 Urinary tract infection, site not specified: Secondary | ICD-10-CM | POA: Diagnosis not present

## 2018-06-16 DIAGNOSIS — C4441 Basal cell carcinoma of skin of scalp and neck: Secondary | ICD-10-CM | POA: Diagnosis not present

## 2018-06-17 DIAGNOSIS — E559 Vitamin D deficiency, unspecified: Secondary | ICD-10-CM | POA: Diagnosis not present

## 2018-06-17 DIAGNOSIS — E039 Hypothyroidism, unspecified: Secondary | ICD-10-CM | POA: Diagnosis not present

## 2018-06-17 DIAGNOSIS — M818 Other osteoporosis without current pathological fracture: Secondary | ICD-10-CM | POA: Diagnosis not present

## 2018-06-28 DIAGNOSIS — C4441 Basal cell carcinoma of skin of scalp and neck: Secondary | ICD-10-CM | POA: Diagnosis not present

## 2019-11-08 ENCOUNTER — Encounter: Payer: Self-pay | Admitting: Family Medicine

## 2019-11-08 ENCOUNTER — Ambulatory Visit: Payer: Medicare Other | Admitting: Family Medicine

## 2019-11-08 ENCOUNTER — Other Ambulatory Visit: Payer: Self-pay

## 2019-11-08 VITALS — BP 106/78 | Ht 61.0 in | Wt 135.0 lb

## 2019-11-08 DIAGNOSIS — M25562 Pain in left knee: Secondary | ICD-10-CM | POA: Diagnosis not present

## 2019-11-08 NOTE — Patient Instructions (Signed)
Your exam and ultrasound are reassuring. You strained your patellar tendon. Wait about a week to start doing the rehab exercises. Icing 15 minutes at a time 3-4 times a day. Patellar tendon strap when up and walking around if this provides relief. Tylenol as needed. Follow up with me in 1 month to 6 weeks (or as needed if you're doing well).

## 2019-11-08 NOTE — Progress Notes (Signed)
PCP: Angela Burke, NP  Subjective:   HPI: Patient is a 59 y.o. female here for left knee pain.  Patient reports on Saturday she lost her balance and fell down 4 stone stairs. She was very careful to not land on left knee given prior injury. Since then she's had pain in anterior aspect of left knee with some swelling. No bruising here. No catching, giving out. Has been icing, elevating, taking tylenol. Has been limping due to pain.  Past Medical History:  Diagnosis Date  . Abdominal tenderness   . Anorexia nervosa   . Anxiety state, unspecified   . Attention deficit disorder with hyperactivity(314.01)   . Benign neoplasm of choroid    Right  . Borderline personality disorder ([place])   . Carbuncle and furuncle of trunk   . Contact dermatitis and other eczema, due to unspecified cause   . Contusion of knee   . Esophageal reflux   . Mastodynia   . Osteoporosis, unspecified   . Painful respiration   . Swelling, mass, or lump in chest   . Thyroid disease   . 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 Transient disorder of initiating or maintaining sleep   . Unspecified hypothyroidism   . Vasovagal syndrome     Current Outpatient Medications on File Prior to Visit  Medication Sig Dispense Refill  . acyclovir (ZOVIRAX) 400 MG tablet TAKE 1 TABLET (400 MG TOTAL) BY MOUTH 2 TIMES DAILY. INCREASE TO 3 TIMES DAILY FOR FLARES  3  . cyclobenzaprine (FLEXERIL) 10 MG tablet TAKE 1 TABLET BY MOUTH 3 TIMES A DAY AS NEEDED FOR MUSCLE SPASM 60 tablet 1  . famotidine (PEPCID) 20 MG tablet Take 20 mg by mouth.    . fluticasone (FLONASE) 50 MCG/ACT nasal spray 2 sprays by Each Nare route daily as needed.     . ibandronate (BONIVA) 150 MG tablet Take 150 mg by mouth.    . levothyroxine (SYNTHROID, LEVOTHROID) 100 MCG tablet TAKE ONE TABLET BY MOUTH ONCE DAILY AS DIRECTED    . midodrine (PROAMATINE) 5 MG tablet TAKE ONE TABLET BY MOUTH THREE TIMES DAILY    .  traMADol (ULTRAM) 50 MG tablet Take 1 tablet (50 mg total) by mouth every 6 (six) hours as needed. 60 tablet 0   No current facility-administered medications on file prior to visit.    Past Surgical History:  Procedure Laterality Date  . CHOLECYSTECTOMY    . THYROID SURGERY    . TONSILLECTOMY    . TONSILLECTOMY      Allergies  Allergen Reactions  . Cephalexin Anaphylaxis  . Doxycycline Rash and Shortness Of Breath  . Erythromycin Base Other (See Comments) and Shortness Of Breath    GI Upset  . Azithromycin Other (See Comments)  . Bee Venom Other (See Comments)  . Clarithromycin   . Codeine   . Darvon   . Metronidazole Other (See Comments)    Mouth sores  . Nsaids Other (See Comments)    GI Upset  . Penicillins   . Poison Oak Extract Other (See Comments)  . Sulfa Antibiotics   . Vitamin E Other (See Comments)  . Ivp Dye [Iodinated Diagnostic Agents] Nausea And Vomiting, Rash and Other (See Comments)    Dyspnea  . Prednisone Nausea And Vomiting and Rash    Social History   Socioeconomic History  . Marital status: Single    Spouse name: Not on file  . Number of children:  Not on file  . Years of education: Not on file  . Highest education level: Not on file  Occupational History  . Not on file  Tobacco Use  . Smoking status: Never Smoker  . Smokeless tobacco: Never Used  Substance and Sexual Activity  . Alcohol use: No    Alcohol/week: 0.0 standard drinks  . Drug use: No  . Sexual activity: Not on file  Other Topics Concern  . Not on file  Social History Narrative  . Not on file   Social Determinants of Health   Financial Resource Strain:   . Difficulty of Paying Living Expenses:   Food Insecurity:   . Worried About Charity fundraiser in the Last Year:   . Arboriculturist in the Last Year:   Transportation Needs:   . Film/video editor (Medical):   Marland Kitchen Lack of Transportation (Non-Medical):   Physical Activity:   . Days of Exercise per Week:   .  Minutes of Exercise per Session:   Stress:   . Feeling of Stress :   Social Connections:   . Frequency of Communication with Friends and Family:   . Frequency of Social Gatherings with Friends and Family:   . Attends Religious Services:   . Active Member of Clubs or Organizations:   . Attends Archivist Meetings:   Marland Kitchen Marital Status:   Intimate Partner Violence:   . Fear of Current or Ex-Partner:   . Emotionally Abused:   Marland Kitchen Physically Abused:   . Sexually Abused:     History reviewed. No pertinent family history.  BP 106/78   Ht 5\' 1"  (1.549 m)   Wt 135 lb (61.2 kg)   BMI 25.51 kg/m   Review of Systems: See HPI above.     Objective:  Physical Exam:  Gen: NAD, comfortable in exam room  Left knee: No gross deformity, ecchymoses, swelling, effusion. TTP patellar tendon.  No joint line, other tenderness. FROM with 5/5 strength - mild pain in patellar tendon with flexion. Negative ant/post drawers. Negative valgus/varus testing. Negative lachmans, lever. Negative mcmurrays, apleys, patellar apprehension. NV intact distally.   MSK u/s left knee:  No effusion.  Patellar tendon intact without tears or neovascularity.  Assessment & Plan:  1. Left knee pain - 2/2 patellar tendon strain.  Icing, patellar tendon strap.  Home exercise program to start in 1 week.  F/u in 1 month to 6 weeks if not improving.

## 2020-10-17 ENCOUNTER — Ambulatory Visit: Payer: Self-pay

## 2020-10-17 ENCOUNTER — Other Ambulatory Visit: Payer: Self-pay

## 2020-10-17 ENCOUNTER — Encounter: Payer: Self-pay | Admitting: Family Medicine

## 2020-10-17 ENCOUNTER — Telehealth: Payer: Self-pay | Admitting: Family Medicine

## 2020-10-17 ENCOUNTER — Ambulatory Visit (INDEPENDENT_AMBULATORY_CARE_PROVIDER_SITE_OTHER): Payer: Medicare HMO | Admitting: Family Medicine

## 2020-10-17 ENCOUNTER — Ambulatory Visit (HOSPITAL_BASED_OUTPATIENT_CLINIC_OR_DEPARTMENT_OTHER)
Admission: RE | Admit: 2020-10-17 | Discharge: 2020-10-17 | Disposition: A | Discharge status: Home / Self Care | Payer: Medicare HMO | Source: Ambulatory Visit | Attending: Family Medicine | Admitting: Family Medicine

## 2020-10-17 VITALS — BP 108/72 | Ht 61.0 in | Wt 135.0 lb

## 2020-10-17 DIAGNOSIS — M25561 Pain in right knee: Secondary | ICD-10-CM

## 2020-10-17 DIAGNOSIS — S83241A Other tear of medial meniscus, current injury, right knee, initial encounter: Secondary | ICD-10-CM

## 2020-10-17 DIAGNOSIS — S83206A Unspecified tear of unspecified meniscus, current injury, right knee, initial encounter: Secondary | ICD-10-CM

## 2020-10-17 NOTE — Telephone Encounter (Signed)
Informed of results.   Rosemarie Ax, MD [facility] 10/17/2020, 1:58 PM

## 2020-10-17 NOTE — Patient Instructions (Signed)
Nice to meet you Please try ice  Please continue the brace  Please try the range of motion movements.   I will call with the results from today  Please send me a message in MyChart with any questions or updates.  We will setup a virtual visit once the MRI is resulted.   --Dr. Raeford Razor

## 2020-10-17 NOTE — Progress Notes (Signed)
Melanie Hartman - 60 y.o. female MRN [id number]  Date of birth: 05-29-1961  SUBJECTIVE:  Including CC & ROS.  No chief complaint on file.   Melanie Hartman is a 60 y.o. female that is presenting with acute right knee pain.  She had a fall a few days ago.  She is presenting with acute anterior pain.  Unable to bear weight.  Pain is severe in nature.  No history of surgery.   Review of Systems See HPI   HISTORY: Past Medical, Surgical, Social, and Family History Reviewed & Updated per EMR.   Pertinent Historical Findings include:  Past Medical History:  Diagnosis Date  . Abdominal tenderness   . Anorexia nervosa   . Anxiety state, unspecified   . Attention deficit disorder with hyperactivity(314.01)   . Benign neoplasm of choroid    Right  . Borderline personality disorder ([place])   . Carbuncle and furuncle of trunk   . Contact dermatitis and other eczema, due to unspecified cause   . Contusion of knee   . Esophageal reflux   . Mastodynia   . Osteoporosis, unspecified   . Painful respiration   . Swelling, mass, or lump in chest   . Thyroid disease   . 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 Transient disorder of initiating or maintaining sleep   . Unspecified hypothyroidism   . Vasovagal syndrome     Past Surgical History:  Procedure Laterality Date  . CHOLECYSTECTOMY    . THYROID SURGERY    . TONSILLECTOMY    . TONSILLECTOMY      No family history on file.  Social History   Socioeconomic History  . Marital status: Single    Spouse name: Not on file  . Number of children: Not on file  . Years of education: Not on file  . Highest education level: Not on file  Occupational History  . Not on file  Tobacco Use  . Smoking status: Never Smoker  . Smokeless tobacco: Never Used  Substance and Sexual Activity  . Alcohol use: No    Alcohol/week: 0.0 standard drinks  . Drug use: No  . Sexual activity: Not on file  Other Topics Concern   . Not on file  Social History Narrative  . Not on file   Social Determinants of Health   Financial Resource Strain: Not on file  Food Insecurity: Not on file  Transportation Needs: Not on file  Physical Activity: Not on file  Stress: Not on file  Social Connections: Not on file  Intimate Partner Violence: Not on file     PHYSICAL EXAM:  VS: BP 108/72 (BP Location: Left Arm, Patient Position: Sitting, Cuff Size: Normal)   Ht 5\' 1"  (1.549 m)   Wt 135 lb (61.2 kg)   BMI 25.51 kg/m  Physical Exam Gen: NAD, alert, cooperative with exam, well-appearing MSK:  Right knee: No effusion. Limited extension and flexion. No instability. Positive McMurray's test. Neurovascular intact  Limited ultrasound: Right knee:  No effusion with the suprapatellar pouch. Normal-appearing quadricep and patellar tendon. Significant hyperemia within the infrapatellar fat pad. Mild infrapatellar bursitis. No change appreciated the medial and lateral joint space  Summary: Fat pad irritation  Ultrasound and interpretation by Clearance Coots, MD    ASSESSMENT & PLAN:   Acute meniscal tear of knee, right, initial encounter Acute injury with a twisting mechanism.  Now she is unable to bear weight and having significant pain on  exam.  Concern for underlying fracture versus meniscal tear. -Counseled on home exercise therapy and supportive care. -Pennsaid samples. -X-ray. -MRI to evaluate for internal derangement.

## 2020-10-17 NOTE — Progress Notes (Signed)
Medication Samples have been provided to the patient.  Drug name: Pennsaid      Strength: 2%       Qty: 2 boxes LOT: [id number]  Exp.Date:11/2021  Dosing instructions: use a pea sized amount on effected area twice daily  The patient has been instructed regarding the correct time, dose, and frequency of taking this medication, including desired effects and most common side effects.   Melanie Hartman Resides 9:46 AM 10/17/2020

## 2020-10-17 NOTE — Addendum Note (Signed)
Addended by: Cyd Silence on: 10/17/2020 02:31 PM   Modules accepted: Orders

## 2020-10-17 NOTE — Telephone Encounter (Signed)
Forwarding message to provider or Med Asst if xray results are in, patient request a call back.Melanie Hartman  ---Patient also was told MRI availability @ [facility]  Possibly this Saturday & would like to get it done there if possible.  --glh

## 2020-10-17 NOTE — Assessment & Plan Note (Signed)
Acute injury with a twisting mechanism.  Now she is unable to bear weight and having significant pain on exam.  Concern for underlying fracture versus meniscal tear. -Counseled on home exercise therapy and supportive care. -Pennsaid samples. -X-ray. -MRI to evaluate for internal derangement.

## 2020-10-21 ENCOUNTER — Telehealth: Payer: Self-pay | Admitting: Family Medicine

## 2020-10-21 NOTE — Telephone Encounter (Signed)
Pt cld concerned that no update rcvd re: MRI, advised her office clsd Friday & and all her information was sent to [facility] for approval on 4/21 on same day she was in the office.  -- Airline pilot approval which must be in place before appt  MRI  scheduled ( shared this info w/patient) --glh

## 2020-10-23 ENCOUNTER — Telehealth (INDEPENDENT_AMBULATORY_CARE_PROVIDER_SITE_OTHER): Payer: Medicare HMO | Admitting: Family Medicine

## 2020-10-23 ENCOUNTER — Other Ambulatory Visit: Payer: Self-pay

## 2020-10-23 ENCOUNTER — Telehealth: Payer: Self-pay | Admitting: Family Medicine

## 2020-10-23 DIAGNOSIS — S8001XD Contusion of right knee, subsequent encounter: Secondary | ICD-10-CM | POA: Diagnosis not present

## 2020-10-23 NOTE — Progress Notes (Signed)
Virtual Visit via Video Note  I connected with Melanie Hartman on 10/23/20 at 11:10 AM EDT by a video enabled telemedicine application and verified that I am speaking with the correct person using two identifiers.  Location: Patient: home Provider: office   I discussed the limitations of evaluation and management by telemedicine and the availability of in person appointments. The patient expressed understanding and agreed to proceed.  History of Present Illness:  Melanie Hartman is a 60 year old female is following up after the MRI of her right knee.  This was demonstrating bone contusions of the inferior pole of the patella and lateral tibial metaphysis without fracture.   Observations/Objective:  Gen: NAD, alert, cooperative with exam, well-appearing   Assessment and Plan:  Patellar contusion of the right knee: MRI was revealing for contusion of the inferior pole of the patella.  No structural changes appreciated. -Counseled on home exercise therapy and supportive care. -Could consider physical therapy.  Follow Up Instructions:    I discussed the assessment and treatment plan with the patient. The patient was provided an opportunity to ask questions and all were answered. The patient agreed with the plan and demonstrated an understanding of the instructions.   The patient was advised to call back or seek an in-person evaluation if the symptoms worsen or if the condition fails to improve as anticipated.   Clearance Coots, MD

## 2020-10-23 NOTE — Telephone Encounter (Signed)
Patient called request her records be sent to [facility] [place] [phone].  --Dion Body

## 2020-10-23 NOTE — Assessment & Plan Note (Signed)
MRI was revealing for contusion of the inferior pole of the patella.  No structural changes appreciated. -Counseled on home exercise therapy and supportive care. -Could consider physical therapy.

## 2022-10-12 ENCOUNTER — Encounter: Payer: Self-pay | Admitting: *Deleted
